# Patient Record
Sex: Female | Born: 1995
Health system: Southern US, Community
[De-identification: ages and names within clinical notes are randomized; demographics above are authoritative.]

## PROBLEM LIST (undated history)

## (undated) DIAGNOSIS — N912 Amenorrhea, unspecified: Secondary | ICD-10-CM

## (undated) DIAGNOSIS — D352 Benign neoplasm of pituitary gland: Secondary | ICD-10-CM

## (undated) DIAGNOSIS — G43009 Migraine without aura, not intractable, without status migrainosus: Secondary | ICD-10-CM

## (undated) DIAGNOSIS — N939 Abnormal uterine and vaginal bleeding, unspecified: Secondary | ICD-10-CM

## (undated) DIAGNOSIS — E282 Polycystic ovarian syndrome: Secondary | ICD-10-CM

## (undated) DIAGNOSIS — E228 Other hyperfunction of pituitary gland: Secondary | ICD-10-CM

## (undated) HISTORY — DX: Amenorrhea, unspecified: N91.2

## (undated) HISTORY — PX: TYMPANOPLASTY: SHX33

## (undated) HISTORY — DX: Other hyperfunction of pituitary gland: E22.8

## (undated) HISTORY — DX: Abnormal uterine and vaginal bleeding, unspecified: N93.9

## (undated) HISTORY — DX: Polycystic ovarian syndrome: E28.2

## (undated) HISTORY — PX: TONSILLECTOMY AND ADENOIDECTOMY: SHX28

## (undated) HISTORY — DX: Migraine without aura, not intractable, without status migrainosus: G43.009

## (undated) HISTORY — DX: Benign neoplasm of pituitary gland: D35.2

---
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
## 2017-01-13 LAB — TSH: TSH: 1.49 mIU/L (ref 0.50–4.30)

## 2017-01-13 LAB — T3, FREE: T3 FREE: 3 pg/mL (ref 3.0–4.7)

## 2017-01-13 LAB — T4, FREE: FREE T4: 1.2 ng/dL (ref 0.8–1.4)

## 2017-01-14 ENCOUNTER — Encounter (INDEPENDENT_AMBULATORY_CARE_PROVIDER_SITE_OTHER): Payer: Self-pay

## 2017-01-21 ENCOUNTER — Other Ambulatory Visit: Payer: Self-pay | Admitting: "Endocrinology

## 2017-01-28 ENCOUNTER — Encounter: Payer: Self-pay | Admitting: Obstetrics and Gynecology

## 2017-01-28 ENCOUNTER — Ambulatory Visit (INDEPENDENT_AMBULATORY_CARE_PROVIDER_SITE_OTHER): Payer: PRIVATE HEALTH INSURANCE | Admitting: "Endocrinology

## 2017-01-28 ENCOUNTER — Ambulatory Visit (INDEPENDENT_AMBULATORY_CARE_PROVIDER_SITE_OTHER): Payer: PRIVATE HEALTH INSURANCE | Admitting: Obstetrics and Gynecology

## 2017-01-28 VITALS — BP 100/68 | HR 100 | Wt 180.0 lb

## 2017-01-28 VITALS — BP 98/62 | HR 76 | Resp 14 | Wt 183.0 lb

## 2017-01-28 DIAGNOSIS — L680 Hirsutism: Secondary | ICD-10-CM

## 2017-01-28 DIAGNOSIS — R1013 Epigastric pain: Secondary | ICD-10-CM

## 2017-01-28 DIAGNOSIS — E049 Nontoxic goiter, unspecified: Secondary | ICD-10-CM | POA: Diagnosis not present

## 2017-01-28 DIAGNOSIS — Z3009 Encounter for other general counseling and advice on contraception: Secondary | ICD-10-CM

## 2017-01-28 DIAGNOSIS — E236 Other disorders of pituitary gland: Secondary | ICD-10-CM

## 2017-01-28 DIAGNOSIS — R7303 Prediabetes: Secondary | ICD-10-CM

## 2017-01-28 DIAGNOSIS — E063 Autoimmune thyroiditis: Secondary | ICD-10-CM

## 2017-01-28 DIAGNOSIS — R7989 Other specified abnormal findings of blood chemistry: Secondary | ICD-10-CM

## 2017-01-28 DIAGNOSIS — E282 Polycystic ovarian syndrome: Secondary | ICD-10-CM

## 2017-01-28 LAB — POCT GLYCOSYLATED HEMOGLOBIN (HGB A1C): Hemoglobin A1C: 4.9

## 2017-01-28 LAB — POCT GLUCOSE (DEVICE FOR HOME USE): Glucose Fasting, POC: 88 mg/dL (ref 70–99)

## 2017-01-28 MED ORDER — METFORMIN HCL 500 MG PO TABS: ORAL_TABLET | ORAL | 3 refills | Status: DC

## 2017-01-28 MED ORDER — RANITIDINE HCL 150 MG PO TABS: ORAL_TABLET | ORAL | 3 refills | Status: DC

## 2017-01-28 MED ORDER — DROSPIRENONE-ETHINYL ESTRADIOL 3-0.03 MG PO TABS: 1.0000 | ORAL_TABLET | Freq: Every day | ORAL | 2 refills | Status: DC

## 2017-01-28 NOTE — Progress Notes (Signed)
Subjective:  Subjective  Patient Name: Anna Parker Date of Birth: Nov 27, 1995  MRN: [id number]  Anna Parker  presents to the office today for follow-up evaluation and management of her history of goiter, oligomenorrhea, elevated testosterone, PCOS, hirsutism, fatigue, dyspepsia, and pituitary cyst/area of differential enhancement.  HISTORY OF PRESENT ILLNESS:   Anna Parker is a 21 y.o. Caucasian young lady.   [name] was unaccompanied.  29. [name] is a 21 y.o. young lady who was followed from an early age for precocious  pubertal development.   A. The child started [facility] agonist therapy as a very young child. I took care of her from 2006 to early 2012. At about that time she stopped her [facility] agonist therapy.    B. As part of her evaluation for precocity, Anna Parker had an MRI scan of her brain, performed with and without contrast in 2006, which revealed an "oval area of differential enhancement measuring 5.8 x 2.9 x 3.2 mm which was c/w a pituitary cyst, Rathke's cleft cyst, or small pituitary microadenoma".  There was also a "tiny area of increased signal on precontrast T1-weighted images along the mid to lower infundibulum".   C. A follow up brain MRI scan was performed with and without contrast in 2008. The radiologist stated that, "The previously identified cystic structure has involuted and the gland is now normal. There was a tiny questioned focus of T1 shortening on precontrast images near the infundibulum. I believe this are too has normalized. It may have represented a partial volume averaging of the clivus."   2. On 01/17/14 [name] returned to our clinic, after a 3-year hiatus, for re-evaluation of her pituitary lesion and was seen by Dr. Baldo Ash. [name] had been discovered to have a benign tumor of the pituitary that had damaged his vision. Dr. Everitt Amber, our pediatric ophthalmologist, felt that she deserved an updated pituitary evaluation given the family history and her personal  history of pituitary findings. Mom also stated that they were told at their last endocrine visit that she would need repeat imaging in 5 years.   A. She had achieved a normal adult height and was at a healthy weight for her height. She reported normal menses every 28-36 days. She had not had significant changes in her vision, severe headaches, or galactorrhea.   B. A follow up MRI, with and without contrast, was performed on 01/27/14. Dr. [name] saw a 5 mm area of differential enhancement posterior to the insertion of the infundibulum. This area was smaller than in 2006 and was less intense than it was on the pre-contrast images from the MRI in 2008. 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 The appearance is not typical for pituitary microadenoma given the location posterior to the adenohypophysis.".   C. Subsequent follow up MRI on 12/28/15 showed that she had a 6.5 mm lesion posterior to the adenohypophysis. 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   3. Anna Parker's last [facility] visit occurred on 10/20/16. In the interim she has been healthy.   A. She is trying to Eat Right, but has not been exercising.    B. She often only gets 6-7 hours of sleep per night. Her energy level is "great".   C. Family had been eating more salads, veggies, and citrus fruits and less sodas and carbs since Xmas. She is now trying to watch her carb intake again.  D. She is taking metformin, 500 mg, twice daily  and ranitidine, 150 mg, twice daily.   4. Pertinent Review of Systems:  Constitutional: The patient feels "really good". She is doing well emotionally.  Eyes: Her eye glasses and contacts are working well. There are no other recognized eye problems.  Neck: The patient has no complaints of anterior neck swelling, soreness, tenderness, pressure, discomfort, or difficulty swallowing.   Heart: Heart rate increases with exercise or other physical activity. The patient has no  complaints of palpitations, irregular heart beats, chest pain, or chest pressure.   Gastrointestinal: Anna Parker is not having much belly hunger. Bowel movents seem normal. The patient has no complaints of acid reflux, upset stomach, stomach aches or pains, diarrhea, or constipation.  Legs: Muscle mass and strength seem normal. There are no complaints of numbness, tingling, burning, or pain. No edema is noted.  Feet: There are no complaints of numbness, tingling, burning, or pain. No edema is noted. Neurologic: There are no recognized problems with muscle movement and strength, sensation, or coordination. GYN: Her LMP began yesterday. Menses occur regularly now and are not unusually heavy.  She is on OCPs now.  Skin: Since starting OCPs she does not have as much facial hair and does not need to shave very often.    PAST MEDICAL, FAMILY, AND SOCIAL HISTORY  Past Medical History:  Diagnosis Date  . Abnormal uterine bleeding   . Amenorrhea   . Central precocious puberty ([place])   . PCOS (polycystic ovarian syndrome)     Family History  Problem Relation Age of Onset  . Asthma Mother   . Depression Father   . Hypertension Maternal Grandmother   . Cancer Maternal Grandmother        Endometrial ca  . Thyroid disease Maternal Grandmother        hypothyroid  . Hypertension Maternal Grandfather   . Cancer Maternal Grandfather        Stage IV pancreatic CA  . Hyperlipidemia Paternal Grandmother   . Seizures Sister 2       1 seizure--unknown cause--none since     Current Outpatient Prescriptions:  .  drospirenone-ethinyl estradiol (YASMIN,ZARAH,SYEDA) 3-0.03 MG tablet, Take 1 tablet by mouth daily., Disp: 1 Package, Rfl: 3 .  fluticasone (FLONASE) 50 MCG/ACT nasal spray, Place 1 spray into both nostrils daily., Disp: , Rfl:  .  metFORMIN (GLUCOPHAGE) 500 MG tablet, TAKE 1 TABLET BY MOUTH TWICE A DAY WITH A MEAL, Disp: 60 tablet, Rfl: 0 .  ranitidine (ZANTAC) 150 MG tablet, TAKE 1 TABLET (150 MG  TOTAL) BY MOUTH 2 (TWO) TIMES DAILY., Disp: 60 tablet, Rfl: 6 .  acetaminophen (TYLENOL) 650 MG CR tablet, Take 650 mg by mouth every 8 (eight) hours as needed for pain., Disp: , Rfl:   Allergies as of 01/28/2017 - Review Complete 01/28/2017  Allergen Reaction Noted  . Amoxicillin-pot clavulanate  02/15/2011  . Lupron depot [leuprolide] Hives 10/28/2016  . Nembutal [pentobarbital sodium]  02/15/2011     reports that she has never smoked. She has never used smokeless tobacco. She reports that she does not drink alcohol or use drugs. Pediatric History  Patient Guardian Status  . Mother:  Weatherford,[name]   Other Topics Concern  . Not on file   Social History Narrative   Is in 9th grade at [employer] at [place] with parents and younger sister, 2 frogs 2 cats   School: She just graduated from high school. She will attend [employer] in [place], [place].   Physical  activities: She has been more physically active.  Primary Care Provider: Dene Gentry, MD  REVIEW OF SYSTEMS: There are no other significant problems involving Brianca's other body systems.    Objective:  Objective  Vital Signs:  BP 100/68   Pulse 100   Wt 180 lb (81.6 kg)   BMI 29.05 kg/m  No height on file for this encounter.   Ht Readings from Last 3 Encounters:  10/28/16 5\' 6"  (1.676 m) (76 %, Z= 0.69)*  10/20/16 5' 6.42" (1.687 m) (80 %, Z= 0.86)*  07/19/16 5' 5.98" (1.676 m) (76 %, Z= 0.69)*   * Growth percentiles are based on CDC 2-20 Years data.   Wt Readings from Last 3 Encounters:  01/28/17 180 lb (81.6 kg) (95 %, Z= 1.66)*  10/28/16 184 lb 9.6 oz (83.7 kg) (96 %, Z= 1.75)*  10/20/16 184 lb (83.5 kg) (96 %, Z= 1.74)*   * Growth percentiles are based on CDC 2-20 Years data.   HC Readings from Last 3 Encounters:  No data found for [facility]   Body surface area is 1.95 meters squared. No height on file for this encounter. 95 %ile (Z= 1.66) based on CDC 2-20 Years weight-for-age data  using vitals from 01/28/2017.    PHYSICAL EXAM:  Constitutional: The patient appears healthy anda bit slimmer. Her height has plateaued at the 75.61%. She has lost 4.5 pounds since her last visit, equivalent to a net calorie deficit of about 150 calories per day. Her weight is at the 95.16%. She is bright and alert, but looks a bit tired today. She is very mature.   Head: The head is normocephalic. Face: The face appears normal, except for minimal residual acne. There are no obvious dysmorphic features. She still has grade 2 sideburns.  Eyes: The eyes appear to be normally formed and spaced. Gaze is conjugate. There is no obvious arcus or proptosis. Moisture appears normal. Ears: The ears are normally placed and appear externally normal. Mouth: The oropharynx and tongue appear normal. Dentition appears to be normal for age. Oral moisture is normal. There is no oral  hyperpigmentation.  Neck: The neck appears to be visibly normal. The thyroid gland is smaller at about 20+ grams in size. The right lobe has shrunk back to normal size and the left lobe is only slightly enlarged. The consistency of the lobes is normal today. The thyroid gland is not tender to palpation. Lungs: The lungs are clear to auscultation. Air movement is good. Heart: Heart rate and rhythm are regular. Heart sounds S1 and S2 are normal. I did not appreciate any pathologic cardiac murmurs. Abdomen: The abdomen is enlarged. Bowel sounds are normal. There is no obvious hepatomegaly, splenomegaly, or other mass effect.  Arms: Muscle size and bulk are normal for age. Hands: There is no obvious tremor. Phalangeal and metacarpophalangeal joints are normal. Palmar muscles are normal for age. Palmar skin is normal. Palmar moisture is also normal. There is no palmar hyperpigmentation. She has no pallor of her fingernails today. Legs: Muscles appear normal for age.  Neurologic: Strength is normal for age in both the upper and lower  extremities. Muscle tone is normal. Sensation to touch is normal in both legs.    LAB DATA:   Results for orders placed or performed in visit on 01/28/17 (from the past 672 hour(s))  POCT Glucose (Device for Home Use)   Collection Time: 01/28/17  8:42 AM  Result Value Ref Range   Glucose Fasting, POC 88 70 -  99 mg/dL   POC Glucose  70 - 99 mg/dl  POCT HgB A1C   Collection Time: 01/28/17  8:49 AM  Result Value Ref Range   Hemoglobin A1C 4.9   Results for orders placed or performed in visit on 12/15/16 (from the past 672 hour(s))  TSH   Collection Time: 01/12/17 10:19 AM  Result Value Ref Range   TSH 1.49 0.50 - 4.30 mIU/L  T4, free   Collection Time: 01/12/17 10:19 AM  Result Value Ref Range   Free T4 1.2 0.8 - 1.4 ng/dL  T3, free   Collection Time: 01/12/17 10:19 AM  Result Value Ref Range   T3, Free 3.0 3.0 - 4.7 pg/mL   Labs 01/28/17: HbA1c 4.9%, CBG 88  Labs 01/12/17: TSH 1.49, free T4 1.1, free T3 3.0  Labs 10/20/16: HbA1c 5.1%, CBG 88  Labs 07/19/16: HbA1c 5.2%  Labs 01/14/16: HbA1c 5.0%  Labs 01/03/16: TSH 1.58, free T4 1.1, free T3 2.9; CBC normal; iron 56  Labs 10/14/15: HbA1c 4.8%  Labs 07/14/15: HbA1c 5.2%  Labs 04/17/15: CMP normal; IGF-1 279, IGFBP-3 5.1  Labs 02/18/15: TSH 1.859, free T4 0.90, free T3 3.5; LH 16, FSH 6.4, testosterone 62, estradiol 45.9; ACTH 18 (normal 9-57), cortisol 12.4 (normal 4.2-22.4); prolactin 7.0; CBC normal; iron 118 (normal 42-145)  IMAGING:   MRI 12/28/15: "Stable 6 mm hyperintense cystic lesion in the posterior sella. 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 There is no expansion of the sella. Pituitary neoplasm is considered unlikely. Calcification or ossification along the posterior meningeal falx. This has not significantly changed. There is no enhancement. This most likely represents benign ossification rather than a meningioma. Recommend 2 year follow-up to assure stability of  this cyst given the patient's age. If the cyst remains stable following puberty, further follow-up may not be necessary.   Assessment and Plan:  Assessment  ASSESSMENT:  1. History of pituitary cyst/abnormal enhancement/brain cyst:   A. This area was smaller on her MRI in 2015 than it was in 2006 and 2008. Dr. [name], a very experienced neuroradiologist, felt that this area did not represent a pituitary tumor per se. She did not have any excessive pituitary hormone secretions. It was highly unlikely that this area was causing her fatigue or oligomenorrhea. However, Dr. [name] also mentioned an area of calcification of the mid-to-posterior falx and recommended follow up.  B. In 2017 her MRI was essentially unchanged. Dr. Jobe Igo was quite comfortable that the 6.5 mm lesion is a Rathke's cleft cyst. He was also comfortable with the ossifications being benign.   C. As can be seen in her labs from June and August 2016, this lesion was non-functional, that is, it was not producing any of the six anterior pituitary hormones. This lesion was also definitely posterior to he adenohypophysis, not part of the pituitary gland as her grand uncle's tumor was said to be.  D. Maternal grand uncle had a pituitary adenoma that affected his optic nerves. 2. Obesity: This problem is a bit better, mostly due to reducing her carb intake since Xmas.     3. Goiter: Her thyroid gland is still enlarged today, but a bit smaller. The process of waxing and waning of thyroid gland size is c/w evolving Hashimoto's thyroiditis. She was euthyroid in May 2017 and again in may 2018. 4. Oligomenorrhea/PCOS/elevated testosterone, hirsutism:   A. The oligomenorrhea resolved with weight loss, but may recur with weight gain.  B. The oligomenorrhea was likely due to her obesity and PCOS/Stein-Leventhal Syndrome (SLS). Her LH/FSH ratio was elevated in June 2016, c/w PCOS/SLS. Her testosterone/estradiol ratio was similarly elevated, also c/w  PCOS/SLS. Her prolactin was normal.    C. Her facial hair has decreased since starting OCPs. Mom has the same problem, as does mom's mother. Analia's hirsutism is not apparent today.  5. Fatigue, other: This problem has resolved. Her TFTs, CBC, iron level, ACTH, and cortisol were all normal in June 2016. We did not find a hormonal cause of her fatigue.  6. Dyspepsia: She has much less dyspepsia since starting metformin and ranitidine and drinking 8 glasses of water per day.  7. Pallor: Resolved. Her CBC and iron concentration were normal in May 2017.   PLAN:  1. Diagnostic: HbA1c and CBG today. Reviewed recent TFTs. Also reviewed past MRI results.  Repeat TFTs in 6 months. Probable repeat of MRI in two years.  2. Therapeutic: Continue metformin, 500 mg, twice daily and ranitidine, 150 mg, twice daily. Continue to Eat Right. Try to exercise for one hour per day.   3. Patient education: I reviewed her medical problems, to include her obesity, PCOS, elevated testosterone, female hirsutism, and the apparent Rathke's cleft cyst. I discussed the fact that obesity is a major risk factor for developing T2DM, hyperlipidemias, and cardiovascular disease. We also discussed her goiter and Hashimoto's disease. We discussed the effects of OCPs on her facial hair issue. Tinlee asked appropriate questions and seemed very pleased with discussion and plan.  4. Follow-up: 6 months    Level of Service: This visit lasted in excess of 65 minutes. More than 50% of the visit was devoted to counseling.  Tillman Sers, MD, CDE Pediatric and Adult Endocrinology

## 2017-01-28 NOTE — Progress Notes (Signed)
GYNECOLOGY  VISIT   HPI: 21 y.o.   Single  Caucasian  female   G0P0000 with Patient's last menstrual period was 01/27/2017.   here for 3 month recheck  For irregular menses.  On Yasmin.  Having regular monthly cycles.  Some lower abdominal pain prior to menses. Bleeds for 3 - 6 days. Pad change every 3 hours.  Less facial hair.   No problems remembering to take them.   Not sexually active.  Going to school near [place].   GYNECOLOGIC HISTORY: Patient's last menstrual period was 01/27/2017. Contraception:  Abstinence/OCP Menopausal hormone therapy:  n/a Last mammogram:  n/a Last pap smear:   n/a        OB History    Gravida Para Term Preterm AB Living   0 0 0 0 0 0   SAB TAB Ectopic Multiple Live Births   0 0 0 0 0         Patient Active Problem List   Diagnosis Date Noted  . Brain cyst 01/14/2016  . Dyspepsia 03/26/2015  . Obesity 02/17/2015  . Oligomenorrhea 02/17/2015  . Other fatigue 02/17/2015  . Goiter 02/17/2015  . Cavus deformity of foot 10/30/2013  . Abnormality of gait 10/30/2013    Past Medical History:  Diagnosis Date  . Abnormal uterine bleeding   . Amenorrhea   . Central precocious puberty ([place])   . PCOS (polycystic ovarian syndrome)     Past Surgical History:  Procedure Laterality Date  . left ankle fracture Left 2003  . left ankle repair Left 2016  . TONSILLECTOMY AND ADENOIDECTOMY    . TYMPANOPLASTY      Current Outpatient Prescriptions  Medication Sig Dispense Refill  . acetaminophen (TYLENOL) 650 MG CR tablet Take 650 mg by mouth every 8 (eight) hours as needed for pain.    . drospirenone-ethinyl estradiol (YASMIN,ZARAH,SYEDA) 3-0.03 MG tablet Take 1 tablet by mouth daily. 1 Package 3  . fluticasone (FLONASE) 50 MCG/ACT nasal spray Place 1 spray into both nostrils daily.    . metFORMIN (GLUCOPHAGE) 500 MG tablet TAKE 1 TABLET BY MOUTH TWICE A DAY WITH A MEAL 180 tablet 3  . ranitidine (ZANTAC) 150 MG tablet TAKE 1 TABLET (150 MG  TOTAL) BY MOUTH 2 (TWO) TIMES DAILY. 180 tablet 3   No current facility-administered medications for this visit.      ALLERGIES: Amoxicillin-pot clavulanate; Lupron depot [leuprolide]; and Nembutal [pentobarbital sodium]  Family History  Problem Relation Age of Onset  . Asthma Mother   . Depression Father   . Hypertension Maternal Grandmother   . Cancer Maternal Grandmother        Endometrial ca  . Thyroid disease Maternal Grandmother        hypothyroid  . Hypertension Maternal Grandfather   . Cancer Maternal Grandfather        Stage IV pancreatic CA  . Hyperlipidemia Paternal Grandmother   . Seizures Sister 2       1 seizure--unknown cause--none since    Social History   Social History  . Marital status: Single    Spouse name: N/A  . Number of children: N/A  . Years of education: N/A   Occupational History  . Not on file.   Social History Main Topics  . Smoking status: Never Smoker  . Smokeless tobacco: Never Used  . Alcohol use No  . Drug use: No  . Sexual activity: No   Other Topics Concern  . Not on file   Social History  Narrative   Is in 9th grade at [employer] at [place] with parents and younger sister, 2 frogs 2 cats    ROS:  Pertinent items are noted in HPI.  PHYSICAL EXAMINATION:    BP 98/62 (BP Location: Right Arm, Patient Position: Sitting, Cuff Size: Normal)   Pulse 76   Resp 14   Wt 183 lb (83 kg)   LMP 01/27/2017   BMI 29.54 kg/m     General appearance: alert, cooperative and appears stated age   ASSESSMENT Hx central precious puberty.  PCOS.  Doing well on Yasmin.  PLAN  Continue Yasmin.  We discussed how to achieve amenorrhea by skipping the placebo pills.  Discussed condom use for STD prevention if she becomes sexually active.  Best wishes for a successful year at school.  Follow up yearly and prn.    An After Visit Summary was printed and given to the patient.  __15____ minutes face to face time of which over 50%  was spent in counseling.

## 2017-01-28 NOTE — Patient Instructions (Signed)
Follow up visit in 6 months. Please repeat blood tests one week prior.

## 2017-01-30 ENCOUNTER — Encounter (INDEPENDENT_AMBULATORY_CARE_PROVIDER_SITE_OTHER): Payer: Self-pay | Admitting: "Endocrinology

## 2017-01-30 DIAGNOSIS — R7989 Other specified abnormal findings of blood chemistry: Secondary | ICD-10-CM | POA: Insufficient documentation

## 2017-01-30 DIAGNOSIS — E282 Polycystic ovarian syndrome: Secondary | ICD-10-CM | POA: Insufficient documentation

## 2017-01-30 DIAGNOSIS — L680 Hirsutism: Secondary | ICD-10-CM | POA: Insufficient documentation

## 2017-02-21 ENCOUNTER — Other Ambulatory Visit: Payer: Self-pay | Admitting: Obstetrics and Gynecology

## 2017-04-05 ENCOUNTER — Encounter (INDEPENDENT_AMBULATORY_CARE_PROVIDER_SITE_OTHER): Payer: Self-pay | Admitting: "Endocrinology

## 2017-11-13 ENCOUNTER — Other Ambulatory Visit: Payer: Self-pay | Admitting: Obstetrics and Gynecology

## 2017-11-14 NOTE — Telephone Encounter (Signed)
Medication refill request: OCP Last OV:  01/28/17 BS  Next AEX: 02/08/18  Last MMG (if hormonal medication request): n/a Refill authorized: 01/28/17 #3, 2RF. Today, please advise.

## 2018-02-08 ENCOUNTER — Encounter: Payer: Self-pay | Admitting: Obstetrics and Gynecology

## 2018-02-08 ENCOUNTER — Other Ambulatory Visit: Payer: Self-pay

## 2018-02-08 ENCOUNTER — Ambulatory Visit (INDEPENDENT_AMBULATORY_CARE_PROVIDER_SITE_OTHER): Payer: PRIVATE HEALTH INSURANCE | Admitting: Obstetrics and Gynecology

## 2018-02-08 VITALS — BP 108/70 | HR 68 | Resp 16 | Ht 66.5 in | Wt 180.0 lb

## 2018-02-08 DIAGNOSIS — Z0000 Encounter for general adult medical examination without abnormal findings: Secondary | ICD-10-CM

## 2018-02-08 MED ORDER — DROSPIRENONE-ETHINYL ESTRADIOL 3-0.03 MG PO TABS: 1.0000 | ORAL_TABLET | Freq: Every day | ORAL | 3 refills | Status: DC

## 2018-02-08 NOTE — Patient Instructions (Signed)

## 2018-02-08 NOTE — Progress Notes (Signed)
22 y.o. G0P0000 Single Caucasian female here for annual exam.    Having menses on OCPs.  Flow normal per patient.  No cramps.  Missed one night per month and takes it the next day.  No spotting with this.   Not sexually active.   Ingrown hairs on her pubic area with shaving so she stopped this.  Back home from school.  Labs with Dr. Tobe Sos.   PCP: Carollee Herter, MD    Dr. Tobe Sos - endocrinology. May establish care at [facility].   Patient's last menstrual period was 01/31/2018.           Sexually active: No.  The current method of family planning is Yasmin.    Exercising: No.  The patient does not participate in regular exercise at present. Smoker:  no  Health Maintenance: Pap:  n/a History of abnormal Pap:  n/a TDaP:  Up to date -- patient in [employer] [place]:   Yes, completed series Screening Labs:  Endocrinologist    reports that she has never smoked. She has never used smokeless tobacco. She reports that she drinks alcohol. She reports that she does not use drugs.  Past Medical History:  Diagnosis Date  . Abnormal uterine bleeding   . Amenorrhea   . Central precocious puberty ([place])   . PCOS (polycystic ovarian syndrome)     Past Surgical History:  Procedure Laterality Date  . left ankle fracture Left 2003  . left ankle repair Left 2016  . TONSILLECTOMY AND ADENOIDECTOMY    . TYMPANOPLASTY      Current Outpatient Medications  Medication Sig Dispense Refill  . acetaminophen (TYLENOL) 650 MG CR tablet Take 650 mg by mouth every 8 (eight) hours as needed for pain.    . drospirenone-ethinyl estradiol (YASMIN,ZARAH,SYEDA) 3-0.03 MG tablet TAKE 1 TABLET BY MOUTH EVERY DAY 84 tablet 0  . fluticasone (FLONASE) 50 MCG/ACT nasal spray Place 1 spray into both nostrils daily.    . metFORMIN (GLUCOPHAGE) 500 MG tablet TAKE 1 TABLET BY MOUTH TWICE A DAY WITH A MEAL 180 tablet 3  . ranitidine (ZANTAC) 150 MG tablet TAKE 1 TABLET (150 MG TOTAL) BY  MOUTH 2 (TWO) TIMES DAILY. 180 tablet 3   No current facility-administered medications for this visit.     Family History  Problem Relation Age of Onset  . Asthma Mother   . Depression Father   . Hypertension Maternal Grandmother   . Cancer Maternal Grandmother        Endometrial ca  . Thyroid disease Maternal Grandmother        hypothyroid  . Hypertension Maternal Grandfather   . Cancer Maternal Grandfather        Stage IV pancreatic CA  . Hyperlipidemia Paternal Grandmother   . Seizures Sister 2       1 seizure--unknown cause--none since    Review of Systems  Constitutional: Negative.   HENT: Negative.   Eyes: Negative.   Respiratory: Negative.   Cardiovascular: Negative.   Gastrointestinal: Negative.   Endocrine: Negative.   Genitourinary: Negative.   Musculoskeletal: Negative.   Skin: Negative.   Allergic/Immunologic: Negative.   Neurological: Negative.   Hematological: Negative.   Psychiatric/Behavioral: Negative.     Exam:   BP 108/70 (BP Location: Right Arm, Patient Position: Sitting, Cuff Size: Normal)   Pulse 68   Resp 16   Ht 5' 6.5" (1.689 m)   Wt 180 lb (81.6 kg)   LMP 01/31/2018   BMI 28.62 kg/m  General appearance: alert, cooperative and appears stated age Head: Normocephalic, without obvious abnormality, atraumatic Neck: no adenopathy, supple, symmetrical, trachea midline and thyroid normal to inspection and palpation Lungs: clear to auscultation bilaterally Breasts: normal appearance, no masses or tenderness, No nipple retraction or dimpling, No nipple discharge or bleeding, No axillary or supraclavicular adenopathy Heart: regular rate and rhythm Abdomen: soft, non-tender; no masses, no organomegaly Extremities: extremities normal, atraumatic, no cyanosis or edema Skin: Skin color, texture, turgor normal. No rashes or lesions Lymph nodes: Cervical, supraclavicular, and axillary nodes normal. No abnormal inguinal nodes palpated Neurologic:  Grossly normal  Pelvic: External genitalia:  Some erythema of hair follicles.                 Chaperone was present for exam.  Assessment:   Well woman visit with normal exam. Hx central precocious puberty.  PCOS.  Plan: Mammogram screening age 74.  Recommended self breast awareness. Pap and HR HPV as above. Guidelines for Calcium, Vitamin D, regular exercise program including cardiovascular and weight bearing exercise. Refill of Yasmin for one year.  We discussed STD prevention.  Labs with endocrinologist.  Follow up annually and prn.   After visit summary provided.

## 2018-03-31 ENCOUNTER — Other Ambulatory Visit (INDEPENDENT_AMBULATORY_CARE_PROVIDER_SITE_OTHER): Payer: Self-pay | Admitting: "Endocrinology

## 2018-03-31 DIAGNOSIS — R7303 Prediabetes: Secondary | ICD-10-CM

## 2018-04-05 ENCOUNTER — Encounter (INDEPENDENT_AMBULATORY_CARE_PROVIDER_SITE_OTHER): Payer: Self-pay | Admitting: "Endocrinology

## 2018-04-05 ENCOUNTER — Ambulatory Visit (INDEPENDENT_AMBULATORY_CARE_PROVIDER_SITE_OTHER): Payer: PRIVATE HEALTH INSURANCE | Admitting: "Endocrinology

## 2018-04-05 VITALS — BP 122/78 | HR 68 | Ht 67.17 in | Wt 181.6 lb

## 2018-04-05 DIAGNOSIS — E049 Nontoxic goiter, unspecified: Secondary | ICD-10-CM

## 2018-04-05 DIAGNOSIS — L680 Hirsutism: Secondary | ICD-10-CM

## 2018-04-05 DIAGNOSIS — E063 Autoimmune thyroiditis: Secondary | ICD-10-CM | POA: Diagnosis not present

## 2018-04-05 DIAGNOSIS — R7303 Prediabetes: Secondary | ICD-10-CM

## 2018-04-05 DIAGNOSIS — E6609 Other obesity due to excess calories: Secondary | ICD-10-CM | POA: Diagnosis not present

## 2018-04-05 DIAGNOSIS — R5383 Other fatigue: Secondary | ICD-10-CM

## 2018-04-05 DIAGNOSIS — G930 Cerebral cysts: Secondary | ICD-10-CM

## 2018-04-05 DIAGNOSIS — R1013 Epigastric pain: Secondary | ICD-10-CM

## 2018-04-05 LAB — POCT GLYCOSYLATED HEMOGLOBIN (HGB A1C): Hemoglobin A1C: 5.1 % (ref 4.0–5.6)

## 2018-04-05 LAB — POCT GLUCOSE (DEVICE FOR HOME USE): POC Glucose: 107 mg/dl — AB (ref 70–99)

## 2018-04-05 MED ORDER — RANITIDINE HCL 150 MG PO TABS: ORAL_TABLET | ORAL | 3 refills | Status: DC

## 2018-04-05 MED ORDER — METFORMIN HCL 500 MG PO TABS: ORAL_TABLET | ORAL | 3 refills | Status: DC

## 2018-04-05 NOTE — Progress Notes (Signed)
Subjective:  Subjective  Patient Name: Anna Parker Date of Birth: [birth year]  MRN: [id number]  Anna Parker  presents to the office today for follow-up evaluation and management of her history of goiter, oligomenorrhea, elevated testosterone, PCOS, hirsutism, fatigue, dyspepsia, and pituitary cyst/area of differential enhancement.  HISTORY OF PRESENT ILLNESS:   Anna Parker is a 22 y.o. Caucasian young lady.   Anna Parker was unaccompanied.  16. Anna Parker is a 22 y.o. young lady who was followed from an early age for precocious  pubertal development.   A. The child started [facility] agonist therapy as a very young child. I took care of her from 2006 to early 2012. At about that time she stopped her [facility] agonist therapy.    B. As part of her evaluation for precocity, Anna Parker had an MRI scan of her brain, performed with and without contrast in 2006, which revealed an "oval area of differential enhancement measuring 5.8 x 2.9 x 3.2 mm which was c/w a pituitary cyst, Rathke's cleft cyst, or small pituitary microadenoma".  There was also a "tiny area of increased signal on precontrast T1-weighted images along the mid to lower infundibulum".   C. A follow up brain MRI scan was performed with and without contrast in 2008. The radiologist stated that, "The previously identified cystic structure has involuted and the gland is now normal. There was a tiny questioned focus of T1 shortening on precontrast images near the infundibulum. I believe this are too has normalized. It may have represented a partial volume averaging of the clivus."   2. On 01/17/14 Anna Parker returned to our clinic, after a 3-year hiatus, for re-evaluation of her pituitary lesion and was seen by Dr. Baldo Ash. [name] had been discovered to have a benign tumor of the pituitary that had damaged his vision. Dr. Everitt Amber, our pediatric ophthalmologist, felt that she deserved an updated pituitary evaluation given the family history and her personal  history of pituitary findings. Mom also stated that they were told at their last endocrine visit that she would need repeat imaging in 5 years.   A. She had achieved a normal adult height and was at a healthy weight for her height. She reported normal menses every 28-36 days. She had not had significant changes in her vision, severe headaches, or galactorrhea.   B. A follow up MRI, with and without contrast, was performed on 01/27/14. Dr. [name] saw a 5 mm area of differential enhancement posterior to the insertion of the infundibulum. This area was smaller than in 2006 and was less intense than it was on the pre-contrast images from the MRI in 2008. 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 The appearance is not typical for pituitary microadenoma given the location posterior to the adenohypophysis.".   C. Subsequent follow up MRI on 12/28/15 showed that she had a 6.5 mm lesion posterior to the adenohypophysis. 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   3. Anna Parker's last [facility] visit occurred on 01/28/17. In the interim she has been healthy.   A. She has been trying to Eat Right and she has been exercising more. She has recently been on vacation, but tried to prevent weight gain.   B. Her energy level is "really great".   C. She has been eating more salads, veggies, and citrus fruits and less sodas and carbs. She has been more physically active.   D. She is taking metformin, 500 mg, twice daily and  ranitidine, 150 mg, twice daily.   E. She sometimes has headaches, especially if her allergies are acting up.  4. Pertinent Review of Systems:  Constitutional: The patient feels "really good". She is doing well emotionally.  Eyes: Her eye glasses and contacts are working well. There are no other recognized eye problems.  Neck: The patient has no complaints of anterior neck swelling, soreness, tenderness, pressure, discomfort, or difficulty swallowing.    Heart: Heart rate increases with exercise or other physical activity. The patient has no complaints of palpitations, irregular heart beats, chest pain, or chest pressure.   Gastrointestinal: Anna Parker is not having much belly hunger. It is easy now to tell when she is full.  Bowel movents seem normal. The patient has no complaints of acid reflux, upset stomach, stomach aches or pains, diarrhea, or constipation.  Legs: Muscle mass and strength seem normal. There are no complaints of numbness, tingling, burning, or pain. No edema is noted.  Feet: There are no complaints of numbness, tingling, burning, or pain. No edema is noted. Neurologic: There are no recognized problems with muscle movement and strength, sensation, or coordination. GYN: Her LMP was in July. Menses occur regularly now and are not unusually heavy.  She is still on OCPs.  Skin: Since starting OCPs she does not have as much facial hair, but she does shave the corners of her mouth about once a week. She sometimes shaves her stomach.  PAST MEDICAL, FAMILY, AND SOCIAL HISTORY  Past Medical History:  Diagnosis Date  . Abnormal uterine bleeding   . Amenorrhea   . Central precocious puberty ([place])   . PCOS (polycystic ovarian syndrome)     Family History  Problem Relation Age of Onset  . Asthma Mother   . Depression Father   . Hypertension Maternal Grandmother   . Cancer Maternal Grandmother        Endometrial ca  . Thyroid disease Maternal Grandmother        hypothyroid  . Hypertension Maternal Grandfather   . Cancer Maternal Grandfather        Stage IV pancreatic CA  . Hyperlipidemia Paternal Grandmother   . Seizures Sister 2       1 seizure--unknown cause--none since   S  Current Outpatient Medications:  .  drospirenone-ethinyl estradiol (YASMIN,ZARAH,SYEDA) 3-0.03 MG tablet, Take 1 tablet by mouth daily., Disp: 84 tablet, Rfl: 3 .  fluticasone (FLONASE) 50 MCG/ACT nasal spray, Place 1 spray into both nostrils daily.,  Disp: , Rfl:  .  metFORMIN (GLUCOPHAGE) 500 MG tablet, TAKE 1 TABLET BY MOUTH TWICE A DAY WITH A MEAL, Disp: 60 tablet, Rfl: 11 .  acetaminophen (TYLENOL) 650 MG CR tablet, Take 650 mg by mouth every 8 (eight) hours as needed for pain., Disp: , Rfl:  .  ranitidine (ZANTAC) 150 MG tablet, TAKE 1 TABLET (150 MG TOTAL) BY MOUTH 2 (TWO) TIMES DAILY., Disp: 180 tablet, Rfl: 3  Allergies as of 04/05/2018 - Review Complete 04/05/2018  Allergen Reaction Noted  . Amoxicillin-pot clavulanate  02/15/2011  . Lupron depot [leuprolide] Hives 10/28/2016  . Nembutal [pentobarbital sodium]  02/15/2011     reports that she has never smoked. She has never used smokeless tobacco. She reports that she drinks alcohol. She reports that she does not use drugs. Pediatric History  Patient Guardian Status  . Mother:  Belay,[name]   Other Topics Concern  . Not on file  Social History Narrative   Is in 9th grade at [employer] at [employer]  Lives with parents and younger sister, 2 frogs 2 cats   School: She just finished her freshman year at [employer] in [place], [place].  She is pre-med. She wants to be a DO and a physiatrist.  Physical activities: She has been more physically active.  Primary Care Provider: Patient, No Pcp Per  REVIEW OF SYSTEMS: There are no other significant problems involving Daniele's other body systems.    Objective:  Objective  Vital Signs:  BP 122/78   Pulse 68   Ht 5' 7.17" (1.706 m)   Wt 181 lb 9.6 oz (82.4 kg)   BMI 28.30 kg/m  Blood pressure percentiles are not available for patients who are 18 years or older.   Ht Readings from Last 3 Encounters:  04/05/18 5' 7.17" (1.706 m) (87 %, Z= 1.13)*  02/08/18 5' 6.5" (1.689 m) (81 %, Z= 0.87)*  10/28/16 5\' 6"  (1.676 m) (76 %, Z= 0.69)*   * Growth percentiles are based on CDC (Girls, 2-20 Years) data.   Wt Readings from Last 3 Encounters:  04/05/18 181 lb 9.6 oz (82.4 kg) (95 %, Z= 1.63)*  02/08/18 180 lb (81.6  kg) (95 %, Z= 1.61)*  01/28/17 183 lb (83 kg) (96 %, Z= 1.71)*   * Growth percentiles are based on CDC (Girls, 2-20 Years) data.   HC Readings from Last 3 Encounters:  No data found for [facility]   Body surface area is 1.98 meters squared. 87 %ile (Z= 1.13) based on CDC (Girls, 2-20 Years) Stature-for-age data based on Stature recorded on 04/05/2018. 95 %ile (Z= 1.63) based on CDC (Girls, 2-20 Years) weight-for-age data using vitals from 04/05/2018.    PHYSICAL EXAM:  Constitutional: The patient appears healthy and a bit slimmer. Her height has plateaued at the 87%. She has lost 1.5 pounds since her last visit. Her weight is at the 94.88%. Her BMI has decreased to the 90.58%. She is bright and alert and looks great today. She is very mature.   Head: The head is normocephalic. Face: The face appears normal. There are no obvious dysmorphic features. She still has grade 2 sideburns.  Eyes: The eyes appear to be normally formed and spaced. Gaze is conjugate. There is no obvious arcus or proptosis. Moisture appears normal. Ears: The ears are normally placed and appear externally normal. Mouth: The oropharynx and tongue appear normal. Dentition appears to be normal for age. Oral moisture is normal. There is no oral  hyperpigmentation.  Neck: The neck appears to be visibly normal. The thyroid gland is again mildly enlarged at about 20+ grams in size. Both lobes are slightly enlarged today.  The consistency of the lobes is somewhat full today. The thyroid gland is not tender to palpation. Lungs: The lungs are clear to auscultation. Air movement is good. Heart: Heart rate and rhythm are regular. Heart sounds S1 and S2 are normal. I did not appreciate any pathologic cardiac murmurs. Abdomen: The abdomen is enlarged. Bowel sounds are normal. There is no obvious hepatomegaly, splenomegaly, or other mass effect.  Arms: Muscle size and bulk are normal for age. Hands: There is no obvious tremor. Phalangeal and  metacarpophalangeal joints are normal. Palmar muscles are normal for age. Palmar skin is normal. Palmar moisture is also normal. There is no palmar hyperpigmentation. She has no pallor of her fingernails today. Legs: Muscles appear normal for age.  Neurologic: Strength is normal for age in both the upper and lower extremities. Muscle tone is normal. Sensation to  touch is normal in both legs.    LAB DATA:   Results for orders placed or performed in visit on 04/05/18 (from the past 672 hour(s))  POCT Glucose (Device for Home Use)   Collection Time: 04/05/18  3:22 PM  Result Value Ref Range   Glucose Fasting, POC  70 - 99 mg/dL   POC Glucose 107 (A) 70 - 99 mg/dl   Labs 03/718: HbA1c 5.1%, CBG 107  Labs 01/28/17: HbA1c 4.9%, CBG 88  Labs 01/12/17: TSH 1.49, free T4 1.1, free T3 3.0  Labs 10/20/16: HbA1c 5.1%, CBG 88  Labs 07/19/16: HbA1c 5.2%  Labs 01/14/16: HbA1c 5.0%  Labs 01/03/16: TSH 1.58, free T4 1.1, free T3 2.9; CBC normal; iron 56  Labs 10/14/15: HbA1c 4.8%  Labs 07/14/15: HbA1c 5.2%  Labs 04/17/15: CMP normal; IGF-1 279, IGFBP-3 5.1  Labs 02/18/15: TSH 1.859, free T4 0.90, free T3 3.5; LH 16, FSH 6.4, testosterone 62, estradiol 45.9; ACTH 18 (normal 9-57), cortisol 12.4 (normal 4.2-22.4); prolactin 7.0; CBC normal; iron 118 (normal 42-145)  IMAGING:   MRI 12/28/15: "Stable 6 mm hyperintense cystic lesion in the posterior sella. 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 There is no expansion of the sella. Pituitary neoplasm is considered unlikely. Calcification or ossification along the posterior meningeal falx. This has not significantly changed. There is no enhancement. This most likely represents benign ossification rather than a meningioma. Recommend 2 year follow-up to assure stability of this cyst given the patient's age. If the cyst remains stable following puberty, further follow-up may not be necessary.   Assessment and Plan:   Assessment  ASSESSMENT:  1. History of pituitary cyst/abnormal enhancement/brain cyst:   A. This area was smaller on her MRI in 2015 than it was in 2006 and 2008. Dr. [name], a very experienced neuroradiologist, felt that this area did not represent a pituitary tumor per se. She did not have any excessive pituitary hormone secretions. It was highly unlikely that this area was causing her fatigue or oligomenorrhea. However, Dr. [name] also mentioned an area of calcification of the mid-to-posterior falx and recommended follow up.  B. In 2017 her MRI was essentially unchanged. Dr. Jobe Igo was quite comfortable that the 6.5 mm lesion is a Rathke's cleft cyst. He was also comfortable with the ossifications being benign.   C. As can be seen in her labs from June and August 2016, this lesion was non-functional, that is, it was not producing any excess amounts of the six anterior pituitary hormones. This lesion was also definitely posterior to he adenohypophysis, not part of the pituitary gland as her grand uncle's tumor was said to be.  D. Maternal grand uncle had a pituitary adenoma that affected his optic nerves.  E. Thus far there has not been any evidence of clinical problems.  2. Overweight: This problem is a little better.  3. Goiter: Her thyroid gland is still enlarged today, but the lobes have shifted in size again. The process of waxing and waning of thyroid gland size is c/w evolving Hashimoto's thyroiditis. She was euthyroid in May 2017 and again in May 2018. 4. Oligomenorrhea/PCOS/elevated testosterone, hirsutism:   A. The oligomenorrhea resolved with weight loss, but may recur with weight gain.    B. The oligomenorrhea was likely due to her obesity and PCOS/Stein-Leventhal Syndrome (SLS). Her LH/FSH ratio was elevated in June 2016, c/w PCOS/SLS. Her testosterone/estradiol ratio was similarly elevated, also c/w PCOS/SLS. Her prolactin was normal.  C. Her facial hair has markedly decreased since  starting OCPs. Mom has the same problem, as does mom's mother. Odell's hirsutism is not apparent today.  5. Fatigue, other: This problem has resolved. Her TFTs, CBC, iron level, ACTH, and cortisol were all normal in June 2016. We did not find a hormonal cause of her fatigue.  6. Dyspepsia: She has much less dyspepsia since starting metformin and ranitidine and drinking 8 glasses of water per day.  7. Pallor: Resolved. Her CBC and iron concentration were normal in May 2017.  PLAN:  1. Diagnostic: HbA1c and CBG today. TFTs, CMP, and CBC today. Also reviewed past MRI results.  Repeat TFTs in 12 months. I offered to repeat her MRI now, but she prefers to wait to do the MRI until December.  2. Therapeutic: Continue metformin, 500 mg, twice daily and ranitidine, 150 mg, twice daily. Continue to Eat Right. Try to exercise for one hour per day.   3. Patient education: I reviewed her medical problems, to include her obesity, PCOS, elevated testosterone, female hirsutism, and the apparent Rathke's cleft cyst. I discussed the fact that obesity is a major risk factor for developing T2DM, hyperlipidemias, cardiovascular disease, and cancer. We also discussed her goiter and Hashimoto's disease. We discussed the effects of OCPs on her facial hair issue. Lequita asked appropriate questions and seemed very pleased with discussion and plan.  4. Follow-up: 4 months    Level of Service: This visit lasted in excess of 60 minutes. More than 50% of the visit was devoted to counseling.  Tillman Sers, MD, CDE Pediatric and Adult Endocrinology

## 2018-04-05 NOTE — Patient Instructions (Signed)
Follow up in 4 months 

## 2018-04-06 LAB — CBC WITH DIFFERENTIAL/PLATELET
BASOS ABS: 38 {cells}/uL (ref 0–200)
BASOS PCT: 0.5 %
Eosinophils Absolute: 98 cells/uL (ref 15–500)
Eosinophils Relative: 1.3 %
HCT: 39.7 % (ref 35.0–45.0)
HEMOGLOBIN: 12.9 g/dL (ref 11.7–15.5)
LYMPHS ABS: 1860 {cells}/uL (ref 850–3900)
MCH: 26.9 pg — ABNORMAL LOW (ref 27.0–33.0)
MCHC: 32.5 g/dL (ref 32.0–36.0)
MCV: 82.7 fL (ref 80.0–100.0)
MPV: 12 fL (ref 7.5–12.5)
Monocytes Relative: 7.5 %
NEUTROS PCT: 65.9 %
Neutro Abs: 4943 cells/uL (ref 1500–7800)
PLATELETS: 208 10*3/uL (ref 140–400)
RBC: 4.8 10*6/uL (ref 3.80–5.10)
RDW: 13.5 % (ref 11.0–15.0)
Total Lymphocyte: 24.8 %
WBC: 7.5 10*3/uL (ref 3.8–10.8)
WBCMIX: 563 {cells}/uL (ref 200–950)

## 2018-04-06 LAB — COMPREHENSIVE METABOLIC PANEL
AG Ratio: 1.5 (calc) (ref 1.0–2.5)
ALBUMIN MSPROF: 4.1 g/dL (ref 3.6–5.1)
ALT: 10 U/L (ref 5–32)
AST: 13 U/L (ref 12–32)
Alkaline phosphatase (APISO): 53 U/L (ref 47–176)
BILIRUBIN TOTAL: 0.3 mg/dL (ref 0.2–1.1)
BUN: 14 mg/dL (ref 7–20)
CO2: 25 mmol/L (ref 20–32)
CREATININE: 0.75 mg/dL (ref 0.50–1.00)
Calcium: 9.5 mg/dL (ref 8.9–10.4)
Chloride: 104 mmol/L (ref 98–110)
Globulin: 2.7 g/dL (calc) (ref 2.0–3.8)
Glucose, Bld: 94 mg/dL (ref 65–99)
POTASSIUM: 4.3 mmol/L (ref 3.8–5.1)
Sodium: 140 mmol/L (ref 135–146)
Total Protein: 6.8 g/dL (ref 6.3–8.2)

## 2018-04-06 LAB — T3, FREE: T3, Free: 3.3 pg/mL (ref 3.0–4.7)

## 2018-04-06 LAB — T4, FREE: FREE T4: 1.2 ng/dL (ref 0.8–1.4)

## 2018-04-06 LAB — TSH: TSH: 0.96 m[IU]/L

## 2018-04-10 ENCOUNTER — Encounter (INDEPENDENT_AMBULATORY_CARE_PROVIDER_SITE_OTHER): Payer: Self-pay | Admitting: *Deleted

## 2018-08-17 ENCOUNTER — Ambulatory Visit (INDEPENDENT_AMBULATORY_CARE_PROVIDER_SITE_OTHER): Payer: PRIVATE HEALTH INSURANCE | Admitting: "Endocrinology

## 2018-08-17 ENCOUNTER — Encounter (INDEPENDENT_AMBULATORY_CARE_PROVIDER_SITE_OTHER): Payer: Self-pay | Admitting: "Endocrinology

## 2018-08-17 VITALS — BP 118/74 | HR 90 | Ht 66.69 in | Wt 182.2 lb

## 2018-08-17 DIAGNOSIS — E049 Nontoxic goiter, unspecified: Secondary | ICD-10-CM | POA: Diagnosis not present

## 2018-08-17 DIAGNOSIS — N914 Secondary oligomenorrhea: Secondary | ICD-10-CM

## 2018-08-17 DIAGNOSIS — E236 Other disorders of pituitary gland: Secondary | ICD-10-CM | POA: Diagnosis not present

## 2018-08-17 DIAGNOSIS — Z68.41 Body mass index (BMI) pediatric, 85th percentile to less than 95th percentile for age: Secondary | ICD-10-CM | POA: Diagnosis not present

## 2018-08-17 DIAGNOSIS — R718 Other abnormality of red blood cells: Secondary | ICD-10-CM

## 2018-08-17 DIAGNOSIS — E663 Overweight: Secondary | ICD-10-CM

## 2018-08-17 DIAGNOSIS — R1013 Epigastric pain: Secondary | ICD-10-CM

## 2018-08-17 LAB — POCT GLUCOSE (DEVICE FOR HOME USE): POC Glucose: 101 mg/dl — AB (ref 70–99)

## 2018-08-17 LAB — POCT GLYCOSYLATED HEMOGLOBIN (HGB A1C): Hemoglobin A1C: 5 % (ref 4.0–5.6)

## 2018-08-17 NOTE — Patient Instructions (Signed)
Follow up visit in 6 months. 

## 2018-08-17 NOTE — Progress Notes (Signed)
Subjective:  Subjective  Patient Name: Anna Parker Date of Birth: 10/05/1995  MRN: [id number]  Anna Parker  presents to the office today for follow-up evaluation and management of her history of goiter, oligomenorrhea, elevated testosterone, PCOS, hirsutism, fatigue, dyspepsia, and pituitary cyst/area of differential enhancement.  HISTORY OF PRESENT ILLNESS:   [name] is a 22 y.o. Caucasian young lady.   Lashun was accompanied by her mother.  30. Brianny is a 24 y.o. young lady who was followed from an early age for precocious  pubertal development.   A. The child started [facility] agonist therapy as a very young child. I took care of her from 2006 to early 2012. At about that time she stopped her [facility] agonist therapy.    B. As part of her evaluation for precocity, Anna Parker had an MRI scan of her brain, performed with and without contrast in 2006, which revealed an "oval area of differential enhancement measuring 5.8 x 2.9 x 3.2 mm which was c/w a pituitary cyst, Rathke's cleft cyst, or small pituitary microadenoma".  There was also a "tiny area of increased signal on precontrast T1-weighted images along the mid to lower infundibulum".   C. A follow up brain MRI scan was performed with and without contrast in 2008. The radiologist stated that, "The previously identified cystic structure has involuted and the gland is now normal. There was a tiny questioned focus of T1 shortening on precontrast images near the infundibulum. I believe this are too has normalized. It may have represented a partial volume averaging of the clivus."   2. On 01/17/14 [name] returned to our clinic, after a 3-year hiatus, for re-evaluation of her pituitary lesion and was seen by Dr. Baldo Ash. [name] had been discovered to have a benign tumor of the pituitary that had damaged his vision. Dr. Everitt Amber, our pediatric ophthalmologist, felt that she deserved an updated pituitary evaluation given the family history and  her personal history of pituitary findings. Mom also stated that they were told at their last endocrine visit that she would need repeat imaging in 5 years.   A. She had achieved a normal adult height and was at a healthy weight for her height. She reported normal menses every 28-36 days. She had not had significant changes in her vision, severe headaches, or galactorrhea.   B. A follow up MRI, with and without contrast, was performed on 01/27/14. Dr. [name] saw a 5 mm area of differential enhancement posterior to the insertion of the infundibulum. This area was smaller than in 2006 and was less intense than it was on the pre-contrast images from the MRI in 2008. 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 The appearance is not typical for pituitary microadenoma given the location posterior to the adenohypophysis.".   C. Subsequent follow up MRI on 12/28/15 showed that she had a 6.5 mm lesion posterior to the adenohypophysis. 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   3. Anna Parker's last [facility] visit occurred on 04/05/18. In the interim she has been healthy. At that visit I continued her metformin and ranitidine.   A. She has been trying to Eat Right, but has been exercising less.   B. Her energy level is usually "really good".   C. She has been eating more salads, veggies, and citrus fruits and less sodas and carbs.    D. She is taking metformin, 500 mg, twice daily and ranitidine, 150 mg, twice daily.  E. She has had more headaches around finals time.   4. Pertinent Review of Systems:  Constitutional: The patient feels "really good". She is doing well emotionally.  Eyes: Her eye glasses and contacts are working well. There are no other recognized eye problems.  Neck: The patient has no complaints of anterior neck swelling, soreness, tenderness, pressure, discomfort, or difficulty swallowing.   Heart: Heart rate increases with exercise or other  physical activity. The patient has no complaints of palpitations, irregular heart beats, chest pain, or chest pressure.   Gastrointestinal: Anna Parker is not having much belly hunger. She does sometimes have an upset stomach depending upon what foods are served in her dining facility. Bowel movents seem normal. The patient has no complaints of acid reflux, upset stomach, stomach aches or pains, diarrhea, or constipation.  Legs: Muscle mass and strength seem normal. There are no complaints of numbness, tingling, burning, or pain. No edema is noted.  Feet: There are no complaints of numbness, tingling, burning, or pain. No edema is noted. Neurologic: There are no recognized problems with muscle movement and strength, sensation, or coordination. GYN: Her LMP was in this past week. Menses occur regularly now and are not unusually heavy.  She is still on OCPs.  Skin: Since starting OCPs she does not have as much facial hair, but she does shave the corners of her mouth about once a week. The abdominal hair is much less.   PAST MEDICAL, FAMILY, AND SOCIAL HISTORY  Past Medical History:  Diagnosis Date  . Abnormal uterine bleeding   . Amenorrhea   . Central precocious puberty ([place])   . PCOS (polycystic ovarian syndrome)     Family History  Problem Relation Age of Onset  . Asthma Mother   . Depression Father   . Hypertension Maternal Grandmother   . Cancer Maternal Grandmother        Endometrial ca  . Thyroid disease Maternal Grandmother        hypothyroid  . Hypertension Maternal Grandfather   . Cancer Maternal Grandfather        Stage IV pancreatic CA  . Hyperlipidemia Paternal Grandmother   . Seizures Sister 2       1 seizure--unknown cause--none since   S  Current Outpatient Medications:  .  drospirenone-ethinyl estradiol (YASMIN,ZARAH,SYEDA) 3-0.03 MG tablet, Take 1 tablet by mouth daily., Disp: 84 tablet, Rfl: 3 .  metFORMIN (GLUCOPHAGE) 500 MG tablet, Take one tablet, twice daily,  Disp: 180 tablet, Rfl: 3 .  ranitidine (ZANTAC) 150 MG tablet, TAKE 1 TABLET (150 MG TOTAL) BY MOUTH 2 (TWO) TIMES DAILY., Disp: 180 tablet, Rfl: 3 .  acetaminophen (TYLENOL) 650 MG CR tablet, Take 650 mg by mouth every 8 (eight) hours as needed for pain., Disp: , Rfl:  .  fluticasone (FLONASE) 50 MCG/ACT nasal spray, Place 1 spray into both nostrils daily., Disp: , Rfl:   Allergies as of 08/17/2018 - Review Complete 08/17/2018  Allergen Reaction Noted  . Amoxicillin-pot clavulanate  02/15/2011  . Lupron depot [leuprolide] Hives 10/28/2016  . Nembutal [pentobarbital sodium]  02/15/2011     reports that she has never smoked. She has never used smokeless tobacco. She reports current alcohol use. She reports that she does not use drugs. Pediatric History  Patient Parents  . Heinze,[name] (Mother)   Other Topics Concern  . Not on file  Social History Narrative   Is in 9th grade at [employer] at [place] with parents and younger sister,  2 frogs 2 cats   School: She is now a sophomore at [employer] in [place], [place].  She is pre-med. She wants to take nursing courses now.   Physical activities: She has not been very physically active.  Primary Care Provider: Patient, No Pcp Per  REVIEW OF SYSTEMS: There are no other significant problems involving Najai's other body systems.    Objective:  Objective  Vital Signs:  BP 118/74   Pulse 90   Ht 5' 6.69" (1.694 m)   Wt 182 lb 3.2 oz (82.6 kg)   BMI 28.80 kg/m  Blood pressure percentiles are not available for patients who are 18 years or older.   Ht Readings from Last 3 Encounters:  08/17/18 5' 6.69" (1.694 m) (83 %, Z= 0.94)*  04/05/18 5' 7.17" (1.706 m) (87 %, Z= 1.13)*  02/08/18 5' 6.5" (1.689 m) (81 %, Z= 0.87)*   * Growth percentiles are based on CDC (Girls, 2-20 Years) data.   Wt Readings from Last 3 Encounters:  08/17/18 182 lb 3.2 oz (82.6 kg) (95 %, Z= 1.63)*  04/05/18 181 lb 9.6 oz (82.4 kg) (95 %,  Z= 1.63)*  02/08/18 180 lb (81.6 kg) (95 %, Z= 1.61)*   * Growth percentiles are based on CDC (Girls, 2-20 Years) data.   HC Readings from Last 3 Encounters:  No data found for [facility]   Body surface area is 1.97 meters squared. 83 %ile (Z= 0.94) based on CDC (Girls, 2-20 Years) Stature-for-age data based on Stature recorded on 08/17/2018. 95 %ile (Z= 1.63) based on CDC (Girls, 2-20 Years) weight-for-age data using vitals from 08/17/2018.    PHYSICAL EXAM:  Constitutional: The patient appears healthy and a bit slimmer. Her height has plateaued at the 82.60%. She has gained 0.5 pounds since her last visit. Her weight is at the 94.87%. Her BMI has decreased to the 91.17%. She is bright and alert and looks great today. She is very mature.   Head: The head is normocephalic. Face: The face appears normal. There are no obvious dysmorphic features. She still has grade 2 sideburns.  Eyes: The eyes appear to be normally formed and spaced. Gaze is conjugate. There is no obvious arcus or proptosis. Moisture appears normal. Ears: The ears are normally placed and appear externally normal. Mouth: The oropharynx and tongue appear normal. Dentition appears to be normal for age. Oral moisture is normal. There is no oral  hyperpigmentation.  Neck: The neck appears to be visibly normal. The thyroid gland has shrunk back to normal size. The consistency of the lobes is normal. The thyroid gland is not tender to palpation. Lungs: The lungs are clear to auscultation. Air movement is good. Heart: Heart rate and rhythm are regular. Heart sounds S1 and S2 are normal. I did not appreciate any pathologic cardiac murmurs. Abdomen: The abdomen is enlarged. Bowel sounds are normal. There is no obvious hepatomegaly, splenomegaly, or other mass effect.  Arms: Muscle size and bulk are normal for age. Hands: There is no obvious tremor. Phalangeal and metacarpophalangeal joints are normal. Palmar muscles are normal for age.  Palmar skin is normal. Palmar moisture is also normal. There is no palmar hyperpigmentation. She has no pallor of her fingernails today. Legs: Muscles appear normal for age.  Neurologic: Strength is normal for age in both the upper and lower extremities. Muscle tone is normal. Sensation to touch is normal in both legs.    LAB DATA:   Results for orders placed or  performed in visit on 08/17/18 (from the past 672 hour(s))  POCT Glucose (Device for Home Use)   Collection Time: 08/17/18  1:14 PM  Result Value Ref Range   Glucose Fasting, POC     POC Glucose 101 (A) 70 - 99 mg/dl  POCT glycosylated hemoglobin (Hb A1C)   Collection Time: 08/17/18  1:21 PM  Result Value Ref Range   Hemoglobin A1C 5.0 4.0 - 5.6 %   HbA1c POC (<> result, manual entry)     HbA1c, POC (prediabetic range)     HbA1c, POC (controlled diabetic range)     Labs 08/17/18: HbA1c 5.0%, CBG 101  Labs 03/718: HbA1c 5.1%, CBG 107; TSH 1.49, free T4 1.2, fre T3 3.3; CMP normal; CBC  Labs 01/28/17: HbA1c 4.9%, CBG 88  Labs 01/12/17: TSH 1.49, free T4 1.1, free T3 3.0  Labs 10/20/16: HbA1c 5.1%, CBG 88  Labs 07/19/16: HbA1c 5.2%  Labs 01/14/16: HbA1c 5.0%  Labs 01/03/16: TSH 1.58, free T4 1.1, free T3 2.9; CBC normal; iron 56  Labs 10/14/15: HbA1c 4.8%  Labs 07/14/15: HbA1c 5.2%  Labs 04/17/15: CMP normal; IGF-1 279, IGFBP-3 5.1  Labs 02/18/15: TSH 1.859, free T4 0.90, free T3 3.5; LH 16, FSH 6.4, testosterone 62, estradiol 45.9; ACTH 18 (normal 9-57), cortisol 12.4 (normal 4.2-22.4); prolactin 7.0; CBC normal; iron 118 (normal 42-145)  IMAGING:   MRI 12/28/15: "Stable 6 mm hyperintense cystic lesion in the posterior sella. 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 There is no expansion of the sella. Pituitary neoplasm is considered unlikely. Calcification or ossification along the posterior meningeal falx. This has not significantly changed. There is no enhancement. This  most likely represents benign ossification rather than a meningioma. Recommend 2 year follow-up to assure stability of this cyst given the patient's age. If the cyst remains stable following puberty, further follow-up may not be necessary.   Assessment and Plan:  Assessment  ASSESSMENT:  1. History of pituitary cyst/abnormal enhancement/brain cyst:   A. This area was smaller on her MRI in 2015 than it was in 2006 and 2008. Dr. [name], a very experienced neuroradiologist, felt that this area did not represent a pituitary tumor per se. She did not have any excessive pituitary hormone secretions. It was highly unlikely that this area was causing her fatigue or oligomenorrhea. However, Dr. [name] also mentioned an area of calcification of the mid-to-posterior falx and recommended follow up.  B. In 2017 her MRI was essentially unchanged. Dr. Jobe Igo was quite comfortable that the 6.5 mm lesion was a Rathke's cleft cyst. He was also comfortable with the ossifications being benign.   C. As can be seen in her labs from June and August 2016, this lesion was non-functional, that is, it was not producing any excess amounts of the six anterior pituitary hormones. This lesion was also definitely posterior to he adenohypophysis, not part of the pituitary gland as her grand uncle's tumor was said to be.  D. Maternal grand uncle had a pituitary adenoma that affected his optic nerves.  E. Thus far there has not been any evidence of clinical problems.  2. Overweight: This problem is a little worse.  3. Goiter: Her thyroid gland has shrunk back to normal size. The process of waxing and waning of thyroid gland size is c/w evolving Hashimoto's thyroiditis. She was euthyroid in May 2017 and again in May 2018. She was mid-euthyroid in August 4. Oligomenorrhea/PCOS/elevated testosterone, hirsutism:   A. The  oligomenorrhea resolved with weight loss, but may recur with weight gain.   B. The oligomenorrhea was likely due to her  obesity and PCOS/Stein-Leventhal Syndrome (SLS). Her LH/FSH ratio was elevated in June 2016, c/w PCOS/SLS. Her testosterone/estradiol ratio was similarly elevated, also c/w PCOS/SLS. Her prolactin was normal.    C. Her facial hair and abdominal hair have markedly decreased since starting OCPs. Mom has the same problem, as does mom's mother. Minh's hirsutism is not apparent today.  5. Fatigue, other: This problem has resolved. Her TFTs, CBC, iron level, ACTH, and cortisol were all normal in June 2016. We did not find a hormonal cause of her fatigue.  6. Dyspepsia: She has much less dyspepsia since starting metformin and ranitidine and drinking 8 glasses of water per day.  7. Pallor: Resolved. Her CBC and iron concentration were normal in May 2017.  She is not pallid today. 8. Abnormal MCH/abnormal red blood cell: Her MCH was low in August, but all of her other RBC indices were norma. She may be developing iron deficiency again.   PLAN:  1. Diagnostic: HbA1c and CBG today. CBC and iron today. Also reviewed past MRI results.  Repeat TFTs in 12 months. I offered to repeat her MRI now and she wants to do so. I ordered an MRI 2. Therapeutic: Continue metformin, 500 mg, twice daily. Stop Zantac or Sandoz brand of ranitidine. Otherwise, continue ranitidine, 150 mg, twice daily. Continue to Eat Right. Try to exercise for one hour per day.   3. Patient education: I reviewed her medical problems, to include her obesity, PCOS, elevated testosterone, female hirsutism, and the apparent Rathke's cleft cyst. I discussed the fact that obesity is a major risk factor for developing T2DM, hyperlipidemias, cardiovascular disease, and cancer. We also discussed her goiter and Hashimoto's disease. We discussed the effects of OCPs on her facial hair issue. Deepika asked appropriate questions and seemed very pleased with discussion and plan.  4. Follow-up: 6 months    Level of Service: This visit lasted in excess of 60 minutes.  More than 50% of the visit was devoted to counseling.  Tillman Sers, MD, CDE Pediatric and Adult Endocrinology

## 2018-08-18 LAB — CBC WITH DIFFERENTIAL/PLATELET
Absolute Monocytes: 656 cells/uL (ref 200–950)
BASOS PCT: 0.9 %
Basophils Absolute: 71 cells/uL (ref 0–200)
EOS PCT: 1.3 %
Eosinophils Absolute: 103 cells/uL (ref 15–500)
HCT: 40.4 % (ref 35.0–45.0)
Hemoglobin: 13.1 g/dL (ref 11.7–15.5)
Lymphs Abs: 2346 cells/uL (ref 850–3900)
MCH: 26.7 pg — ABNORMAL LOW (ref 27.0–33.0)
MCHC: 32.4 g/dL (ref 32.0–36.0)
MCV: 82.4 fL (ref 80.0–100.0)
MONOS PCT: 8.3 %
MPV: 12.1 fL (ref 7.5–12.5)
NEUTROS ABS: 4724 {cells}/uL (ref 1500–7800)
Neutrophils Relative %: 59.8 %
PLATELETS: 212 10*3/uL (ref 140–400)
RBC: 4.9 10*6/uL (ref 3.80–5.10)
RDW: 13.7 % (ref 11.0–15.0)
TOTAL LYMPHOCYTE: 29.7 %
WBC: 7.9 10*3/uL (ref 3.8–10.8)

## 2018-08-18 LAB — IRON: Iron: 34 ug/dL (ref 27–164)

## 2018-08-21 ENCOUNTER — Encounter (INDEPENDENT_AMBULATORY_CARE_PROVIDER_SITE_OTHER): Payer: Self-pay | Admitting: *Deleted

## 2018-08-29 ENCOUNTER — Telehealth (INDEPENDENT_AMBULATORY_CARE_PROVIDER_SITE_OTHER): Payer: Self-pay | Admitting: "Endocrinology

## 2018-08-29 DIAGNOSIS — G930 Cerebral cysts: Secondary | ICD-10-CM

## 2018-08-29 DIAGNOSIS — E236 Other disorders of pituitary gland: Secondary | ICD-10-CM

## 2018-08-29 NOTE — Telephone Encounter (Signed)
Call to [facility] at [facility] reports needs to determine if the MR Venogram ordered on this patient is to be with and without contrast or just without contrast

## 2018-08-29 NOTE — Telephone Encounter (Signed)
°  Who's calling (name and relationship to patient) :  Imaging  Best contact number: [phone] Provider they see: Tobe Sos Reason for call: Please call concerning upcoming MRI for patient.      PRESCRIPTION REFILL ONLY  Name of prescription:  Pharmacy:

## 2018-08-29 NOTE — Telephone Encounter (Signed)
Call to [place] at [facility] to confirm if a PA is required for MRI.  Return call from [facility] reports that PA is not required. Confirmed with Dr. Tobe Sos that he wants an MRI of Brain with and without contrast not a Venogram.  Call back to [facility] imaging spoke with Mardene Celeste confirmed with her the change of order and that a PA is not required. She reports yes can see the new order and procedure is scheduled

## 2018-09-04 ENCOUNTER — Other Ambulatory Visit: Payer: Self-pay | Admitting: "Endocrinology

## 2018-09-09 ENCOUNTER — Ambulatory Visit
Admission: RE | Admit: 2018-09-09 | Discharge: 2018-09-09 | Disposition: A | Discharge status: Home / Self Care | Payer: PRIVATE HEALTH INSURANCE | Source: Ambulatory Visit | Attending: "Endocrinology | Admitting: "Endocrinology

## 2018-09-09 DIAGNOSIS — G930 Cerebral cysts: Secondary | ICD-10-CM

## 2018-09-09 DIAGNOSIS — E236 Other disorders of pituitary gland: Secondary | ICD-10-CM

## 2018-09-09 MED ORDER — GENERIC EXTERNAL MEDICATION
10.00 | Status: DC
Start: ? — End: 2018-09-09

## 2018-09-09 MED ORDER — GADOBENATE DIMEGLUMINE 529 MG/ML IV SOLN
8.0000 mL | Freq: Once | INTRAVENOUS | Status: AC | PRN
  Administered 2018-09-09: 8 mL via INTRAVENOUS

## 2018-09-09 MED ORDER — SODIUM CHLORIDE 0.9 % IV SOLN
10.00 | INTRAVENOUS | Status: DC
Start: ? — End: 2018-09-09

## 2018-09-13 NOTE — Telephone Encounter (Signed)
Copied from [place] [id number]. Topic: Appointment Scheduling - Scheduling Inquiry for Clinic >> Sep 12, 2018 12:43 PM Sheran Luz wrote: Reason for CRM: Patient calling to inquire if Dr. Birdie Riddle would accept her as new patient stating that she was referred to Dr. Birdie Riddle by Dr. Tobe Sos (Endo). Please advise. >> Sep 12, 2018  1:39 PM Katina Dung, CMA wrote: Routing to provider to advise >> Sep 13, 2018  7:44 AM Midge Minium, MD wrote: Anna Parker to establish as she was referred by specialist

## 2018-09-13 NOTE — Telephone Encounter (Signed)
Spoke with patient on the phone to schedule new patient appt. Patient stated she needed a new patient appt by this Friday. Unable to get her in, patient stated that she will "call back during Spring Break to make an appt." Patient [place] of [place], unaware of when she will be home during spring break.

## 2018-09-13 NOTE — Telephone Encounter (Signed)
Copied from [place] [id number]. Topic: Appointment Scheduling - Scheduling Inquiry for Clinic >> Sep 12, 2018 12:43 PM Sheran Luz wrote: Reason for CRM: Patient calling to inquire if Dr. Birdie Riddle would accept her as new patient stating that she was referred to Dr. Birdie Riddle by Dr. Tobe Sos (Endo). Please advise. >> Sep 12, 2018  1:39 PM Katina Dung, CMA wrote: Routing to provider to advise >> Sep 13, 2018  7:44 AM Midge Minium, MD wrote: Madaline Brilliant to establish as she was referred by specialist

## 2018-11-15 ENCOUNTER — Encounter: Payer: Self-pay | Admitting: Family Medicine

## 2018-11-15 ENCOUNTER — Ambulatory Visit (INDEPENDENT_AMBULATORY_CARE_PROVIDER_SITE_OTHER): Payer: PRIVATE HEALTH INSURANCE | Admitting: Family Medicine

## 2018-11-15 ENCOUNTER — Other Ambulatory Visit: Payer: Self-pay

## 2018-11-15 VITALS — BP 116/80 | HR 71 | Temp 98.1°F | Resp 16 | Ht 67.0 in | Wt 187.1 lb

## 2018-11-15 DIAGNOSIS — E282 Polycystic ovarian syndrome: Secondary | ICD-10-CM

## 2018-11-15 DIAGNOSIS — G43909 Migraine, unspecified, not intractable, without status migrainosus: Secondary | ICD-10-CM | POA: Diagnosis not present

## 2018-11-15 DIAGNOSIS — E236 Other disorders of pituitary gland: Secondary | ICD-10-CM

## 2018-11-15 NOTE — Assessment & Plan Note (Signed)
New to provider, ongoing for pt.  Not currently requiring triptans as she is able to control w/ ibuprofen.  Will continue to follow and adjust tx prn.

## 2018-11-15 NOTE — Assessment & Plan Note (Signed)
New to provider, ongoing for pt.  This was the cause of her precocious puberty.  This may also be contributing to PCOS.  Pt currently asymptomatic but this will require ongoing monitoring.  Will follow.

## 2018-11-15 NOTE — Patient Instructions (Signed)
Schedule your complete physical in 6 months Keep up the good work on healthy diet and regular exercise- you can do it! Call with any questions or concerns Welcome!  We're glad to have you!!!

## 2018-11-15 NOTE — Assessment & Plan Note (Signed)
New to provider, ongoing for pt.  On Metformin and Yaz to improve sxs.  Followed by Endocrinology.  Will follow along and assist as able.

## 2018-11-15 NOTE — Progress Notes (Signed)
   Subjective:    Patient ID: Anna Parker, female    DOB: Aug 09, 1996, 23 y.o.   MRN: [id number]  HPI New to establish.  Previous MD- Dr Sheran Lawless  Peds Endo- Dr Tobe Sos  Migraines- pt has had 2-3 migraines since January.  Has been battling for 2-3 yrs.  Has not taken a repeat dose of Imitrex- able to use Ibuprofen.  + family hx of migraines.  Pt notes increased headaches w/ increased screen time.    Precocious puberty- showed signs of puberty at age 68.  Found benign tumor in posterior pituitary.  Has been following w/ Endo since age 79.  Stopped having periods at age 68- was found to have PCOS.  Started on Metformin and Yaz.  Overall is feeling 'pretty good' w/ exception of fatigue.  Pt reports she has been out of routine w/ all of the school closings.   Review of Systems For ROS see HPI     Objective:   Physical Exam Vitals signs reviewed.  Constitutional:      General: She is not in acute distress.    Appearance: She is well-developed. She is not ill-appearing.  HENT:     Head: Normocephalic and atraumatic.  Eyes:     Conjunctiva/sclera: Conjunctivae normal.     Pupils: Pupils are equal, round, and reactive to light.  Neck:     Musculoskeletal: Normal range of motion and neck supple.     Thyroid: No thyromegaly.  Cardiovascular:     Rate and Rhythm: Normal rate and regular rhythm.     Heart sounds: Normal heart sounds. No murmur.  Pulmonary:     Effort: Pulmonary effort is normal. No respiratory distress.     Breath sounds: Normal breath sounds.  Abdominal:     General: There is no distension.     Palpations: Abdomen is soft.     Tenderness: There is no abdominal tenderness.  Lymphadenopathy:     Cervical: No cervical adenopathy.  Skin:    General: Skin is warm and dry.  Neurological:     Mental Status: She is alert and oriented to person, place, and time.  Psychiatric:        Behavior: Behavior normal.           Assessment & Plan:

## 2018-12-01 ENCOUNTER — Other Ambulatory Visit (INDEPENDENT_AMBULATORY_CARE_PROVIDER_SITE_OTHER): Payer: Self-pay | Admitting: *Deleted

## 2018-12-01 MED ORDER — OMEPRAZOLE 20 MG PO CPDR: 20.0000 mg | DELAYED_RELEASE_CAPSULE | Freq: Two times a day (BID) | ORAL | 1 refills | Status: DC

## 2019-01-29 ENCOUNTER — Other Ambulatory Visit: Payer: Self-pay

## 2019-01-29 ENCOUNTER — Encounter: Payer: Self-pay | Admitting: Family Medicine

## 2019-01-29 ENCOUNTER — Ambulatory Visit (INDEPENDENT_AMBULATORY_CARE_PROVIDER_SITE_OTHER): Payer: PRIVATE HEALTH INSURANCE | Admitting: Family Medicine

## 2019-01-29 VITALS — Ht 67.0 in | Wt 180.0 lb

## 2019-01-29 DIAGNOSIS — H938X2 Other specified disorders of left ear: Secondary | ICD-10-CM | POA: Diagnosis not present

## 2019-01-29 NOTE — Progress Notes (Signed)
   Virtual Visit via Video   I connected with patient on 01/29/19 at  4:00 PM EDT by a video enabled telemedicine application and verified that I am speaking with the correct person using two identifiers.  Location patient: Home Location provider: Acupuncturist, Office Persons participating in the virtual visit: Patient, Provider, [place] (Katie N)  I discussed the limitations of evaluation and management by telemedicine and the availability of in person appointments. The patient expressed understanding and agreed to proceed.  Subjective:   HPI:   Ear pressure- developed 'whooshing' sound in L ear last evening.  'Rhythmic, continual breathing sound'.  Sound has decreased considerably, only mild fullness today.  + PND.  Was out in yard all day yesterday, took Aller-Tec last night.  No HAs or dizziness.  No drainage, no recent water exposure.  ROS:   See pertinent positives and negatives per HPI.  Patient Active Problem List   Diagnosis Date Noted  . Migraine 11/15/2018  . Elevated testosterone level in female 01/30/2017  . Female hirsutism 01/30/2017  . PCOS (polycystic ovarian syndrome) 01/30/2017  . Mass of posterior pituitary ([place]) 01/14/2016  . Dyspepsia 03/26/2015  . Obesity 02/17/2015  . Oligomenorrhea 02/17/2015  . Other fatigue 02/17/2015  . Goiter 02/17/2015  . Cavus deformity of foot 10/30/2013  . Abnormality of gait 10/30/2013    Social History   Tobacco Use  . Smoking status: Never Smoker  . Smokeless tobacco: Never Used  Substance Use Topics  . Alcohol use: Yes    Comment: occasionally    Current Outpatient Medications:  .  drospirenone-ethinyl estradiol (YASMIN,ZARAH,SYEDA) 3-0.03 MG tablet, Take 1 tablet by mouth daily., Disp: 84 tablet, Rfl: 3 .  fluticasone (FLONASE) 50 MCG/ACT nasal spray, Place 1 spray into both nostrils daily., Disp: , Rfl:  .  metFORMIN (GLUCOPHAGE) 500 MG tablet, Take one tablet, twice daily, Disp: 180 tablet, Rfl: 3 .   omeprazole (PRILOSEC) 20 MG capsule, Take 1 capsule (20 mg total) by mouth 2 (two) times daily before a meal., Disp: 180 capsule, Rfl: 1 .  SUMAtriptan (IMITREX) 100 MG tablet, Take by mouth., Disp: , Rfl:   Allergies  Allergen Reactions  . Amoxicillin-Pot Clavulanate   . Lupron Depot [Leuprolide] Hives  . Nembutal [Pentobarbital Sodium]     Objective:   Ht 5\' 7"  (1.702 m)   Wt 180 lb (81.6 kg)   BMI 28.19 kg/m   AAOx3, NAD NCAT, EOMI No obvious CN deficits Coloring WNL Pt is able to speak clearly, coherently without shortness of breath or increased work of breathing.  Thought process is linear.  Mood is appropriate.   Assessment and Plan:   L ear fullness- new.  No pain, no drainage.  Whooshing sound has greatly improved since last night.  Pt has hx of seasonal allergies.  Sxs began after day in the yard and improved w/ use of Aller-Tec.  Suspect this is eustachian tube dysfxn.  Encouraged daily use of antihistamine and nasal steroid until sxs resolve.  If no improvement will need in office visit.  Pt expressed understanding and is in agreement w/ plan.    Annye Asa, MD 01/29/2019

## 2019-02-12 ENCOUNTER — Other Ambulatory Visit (HOSPITAL_COMMUNITY)
Admission: RE | Admit: 2019-02-12 | Discharge: 2019-02-12 | Disposition: A | Discharge status: Home / Self Care | Payer: PRIVATE HEALTH INSURANCE | Source: Ambulatory Visit | Attending: Obstetrics and Gynecology | Admitting: Obstetrics and Gynecology

## 2019-02-12 ENCOUNTER — Ambulatory Visit (INDEPENDENT_AMBULATORY_CARE_PROVIDER_SITE_OTHER): Payer: PRIVATE HEALTH INSURANCE | Admitting: Obstetrics and Gynecology

## 2019-02-12 ENCOUNTER — Other Ambulatory Visit: Payer: Self-pay

## 2019-02-12 ENCOUNTER — Encounter: Payer: Self-pay | Admitting: Obstetrics and Gynecology

## 2019-02-12 VITALS — BP 98/76 | HR 76 | Temp 97.6°F | Resp 12 | Ht 66.0 in | Wt 190.0 lb

## 2019-02-12 DIAGNOSIS — Z01419 Encounter for gynecological examination (general) (routine) without abnormal findings: Secondary | ICD-10-CM | POA: Diagnosis not present

## 2019-02-12 DIAGNOSIS — Z113 Encounter for screening for infections with a predominantly sexual mode of transmission: Secondary | ICD-10-CM | POA: Diagnosis present

## 2019-02-12 MED ORDER — DROSPIRENONE-ETHINYL ESTRADIOL 3-0.03 MG PO TABS: 1.0000 | ORAL_TABLET | Freq: Every day | ORAL | 3 refills | Status: DC

## 2019-02-12 NOTE — Patient Instructions (Signed)

## 2019-02-12 NOTE — Progress Notes (Signed)
23 y.o. G68P0000 Single Caucasian female here for annual exam.    Doing well with her birth control pills.  Has a period with them.  Remembers to take them on time.   Now sexually active for the first time in January, 2020.  Used a condom.   Is starting a Mediterranean diet. Is doing bike rides every week.   She has migraine headaches.  She has a deviated septum.  She has temporal HA once a week.  She denies light sensitivity or nausea.  She denies aura.  Studying at home during the pandemic.  She is waiting to her if she will be studying on line or not.  Interested in nursing and anesthesia.   PCP: Annye Asa, MD    Patient's last menstrual period was 01/30/2019.           Sexually active: No.  The current method of family planning is Yasmin.    Exercising: Yes.    bike rides Smoker:  no  Health Maintenance: Pap:  n/a History of abnormal Pap: n/a TDaP:  Up to date Gardasil:   yes Screening Labs:  Endocrinologist   reports that she has never smoked. She has never used smokeless tobacco. She reports current alcohol use. She reports previous drug use. Drug: Marijuana.  Past Medical History:  Diagnosis Date  . Abnormal uterine bleeding   . Amenorrhea   . Central precocious puberty ([place])   . Migraine headache without aura   . PCOS (polycystic ovarian syndrome)   . Pituitary adenoma [facility])     Past Surgical History:  Procedure Laterality Date  . left ankle fracture Left 2003  . left ankle repair Left 2016  . TONSILLECTOMY AND ADENOIDECTOMY    . TYMPANOPLASTY      Current Outpatient Medications  Medication Sig Dispense Refill  . drospirenone-ethinyl estradiol (YASMIN) 3-0.03 MG tablet Take 1 tablet by mouth daily. 3 Package 3  . fluticasone (FLONASE) 50 MCG/ACT nasal spray Place 1 spray into both nostrils daily.    . metFORMIN (GLUCOPHAGE) 500 MG tablet Take one tablet, twice daily 180 tablet 3  . omeprazole (PRILOSEC) 20 MG capsule Take 1 capsule (20 mg  total) by mouth 2 (two) times daily before a meal. 180 capsule 1  . SUMAtriptan (IMITREX) 100 MG tablet Take by mouth.     No current facility-administered medications for this visit.     Family History  Problem Relation Age of Onset  . Asthma Mother   . ADD / ADHD Mother   . Arthritis Mother   . Depression Father   . Hyperlipidemia Father   . Sleep apnea Father   . Arthritis Father   . Hypertension Maternal Grandmother   . Cancer Maternal Grandmother        Endometrial ca  . Thyroid disease Maternal Grandmother        hypothyroid  . Kidney failure Maternal Grandmother   . Coronary artery disease Maternal Grandmother   . Heart failure Maternal Grandmother   . Atrial fibrillation Maternal Grandmother   . Hypertension Maternal Grandfather   . Cancer Maternal Grandfather        Stage IV pancreatic CA  . Hyperlipidemia Paternal Grandmother   . Hypothyroidism Paternal Grandmother   . Depression Paternal Grandmother   . Macular degeneration Paternal Grandmother   . Cataracts Paternal Grandmother   . Arthritis Paternal Grandfather   . Depression Paternal Grandfather   . Cancer Paternal Grandfather        skin cancer  .  Seizures Sister 2       1 seizure--unknown cause--none since  . Polycystic ovary syndrome Sister     Review of Systems  Constitutional: Negative.   HENT: Negative.   Eyes: Negative.   Respiratory: Negative.   Cardiovascular: Negative.   Gastrointestinal: Negative.   Endocrine: Negative.   Genitourinary: Negative.   Musculoskeletal: Negative.   Skin: Negative.   Allergic/Immunologic: Negative.   Neurological: Negative.   Hematological: Negative.   Psychiatric/Behavioral: Negative.     Exam:   BP 98/76 (BP Location: Left Arm, Patient Position: Sitting, Cuff Size: Normal)   Pulse 76   Temp 97.6 F (36.4 C) (Temporal)   Resp 12   Ht 5\' 6"  (1.676 m)   Wt 190 lb (86.2 kg)   LMP 01/30/2019   BMI 30.67 kg/m     General appearance: alert,  cooperative and appears stated age Head: normocephalic, without obvious abnormality, atraumatic Neck: no adenopathy, supple, symmetrical, trachea midline and thyroid normal to inspection and palpation Lungs: clear to auscultation bilaterally Breasts: normal appearance, no masses or tenderness, No nipple retraction or dimpling, No nipple discharge or bleeding, No axillary adenopathy Heart: regular rate and rhythm Abdomen: soft, non-tender; no masses, no organomegaly Extremities: extremities normal, atraumatic, no cyanosis or edema Skin: skin color, texture, turgor normal. No rashes or lesions Lymph nodes: cervical, supraclavicular, and axillary nodes normal. Neurologic: grossly normal  Pelvic: External genitalia:  no lesions              No abnormal inguinal nodes palpated.              Urethra:  normal appearing urethra with no masses, tenderness or lesions              Bartholins and Skenes: normal                 Vagina: normal appearing vagina with normal color and discharge, no lesions              Cervix: no lesions              Pap taken: No. Bimanual Exam:  Uterus:  normal size, contour, position, consistency, mobility, non-tender              Adnexa: no mass, fullness, tenderness        Chaperone was present for exam.  Assessment:   Well woman visit with normal exam. Hx central precocious puberty.  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  PCOS. Migraine without aura.  Plan: Mammogram screening age 76. Self breast awareness reviewed. Pap next year.  Guidelines for Calcium, Vitamin D, regular exercise program including cardiovascular and weight bearing exercise. Refill of Yasmin for one year.  STD testing.  Condoms discussed.  Follow up annually and prn.   After visit summary provided.

## 2019-02-14 LAB — CERVICOVAGINAL ANCILLARY ONLY
Chlamydia: NEGATIVE
Neisseria Gonorrhea: NEGATIVE
Trichomonas: NEGATIVE

## 2019-02-14 LAB — HEP, RPR, HIV PANEL
HIV Screen 4th Generation wRfx: NONREACTIVE
Hepatitis B Surface Ag: NEGATIVE
RPR Ser Ql: NONREACTIVE

## 2019-02-14 LAB — HEPATITIS C ANTIBODY: Hep C Virus Ab: <0.1 s/co ratio (ref 0.0–0.9)

## 2019-02-16 ENCOUNTER — Ambulatory Visit (INDEPENDENT_AMBULATORY_CARE_PROVIDER_SITE_OTHER): Payer: PRIVATE HEALTH INSURANCE | Admitting: "Endocrinology

## 2019-03-21 ENCOUNTER — Ambulatory Visit (INDEPENDENT_AMBULATORY_CARE_PROVIDER_SITE_OTHER): Payer: PRIVATE HEALTH INSURANCE | Admitting: "Endocrinology

## 2019-03-21 ENCOUNTER — Other Ambulatory Visit: Payer: Self-pay

## 2019-03-21 ENCOUNTER — Encounter (INDEPENDENT_AMBULATORY_CARE_PROVIDER_SITE_OTHER): Payer: Self-pay | Admitting: "Endocrinology

## 2019-03-21 VITALS — BP 108/72 | HR 88 | Ht 66.61 in | Wt 189.0 lb

## 2019-03-21 DIAGNOSIS — E063 Autoimmune thyroiditis: Secondary | ICD-10-CM

## 2019-03-21 DIAGNOSIS — E663 Overweight: Secondary | ICD-10-CM

## 2019-03-21 DIAGNOSIS — R5383 Other fatigue: Secondary | ICD-10-CM

## 2019-03-21 DIAGNOSIS — R718 Other abnormality of red blood cells: Secondary | ICD-10-CM

## 2019-03-21 DIAGNOSIS — E236 Other disorders of pituitary gland: Secondary | ICD-10-CM

## 2019-03-21 DIAGNOSIS — E6609 Other obesity due to excess calories: Secondary | ICD-10-CM | POA: Diagnosis not present

## 2019-03-21 DIAGNOSIS — E049 Nontoxic goiter, unspecified: Secondary | ICD-10-CM | POA: Diagnosis not present

## 2019-03-21 LAB — POCT GLYCOSYLATED HEMOGLOBIN (HGB A1C): Hemoglobin A1C: 5.2 % (ref 4.0–5.6)

## 2019-03-21 LAB — POCT GLUCOSE (DEVICE FOR HOME USE): Glucose Fasting, POC: 117 mg/dL — AB (ref 70–99)

## 2019-03-21 NOTE — Patient Instructions (Signed)
Follow up visit in 5-6 months. Please take either a One-a-Day for Women or a Centrum for Women.

## 2019-03-21 NOTE — Progress Notes (Signed)
Subjective:  Subjective  Patient Name: Dareth Andrew Date of Birth: [birth year]  MRN: [id number]  Braydee Shimkus  presents to the office today for follow-up evaluation and management of her history of goiter, oligomenorrhea, elevated testosterone, PCOS, hirsutism, fatigue, dyspepsia, and pituitary cyst/area of differential enhancement.  HISTORY OF PRESENT ILLNESS:   Dolora is a 23 y.o. Caucasian young lady.   Jerolyn was accompanied by her sister.  19. Tamisha is a 25 y.o. young lady who was followed from an early age for precocious  pubertal development.   A. The child started [facility] agonist therapy as a very young child. I took care of her from 2006 to early 2012. At about that time she stopped her [facility] agonist therapy.    B. As part of her evaluation for precocity, Brynlyn had an MRI scan of her brain, performed with and without contrast in 2006, which revealed an "oval area of differential enhancement measuring 5.8 x 2.9 x 3.2 mm which was c/w a pituitary cyst, Rathke's cleft cyst, or small pituitary microadenoma".  There was also a "tiny area of increased signal on precontrast T1-weighted images along the mid to lower infundibulum".   C. A follow up brain MRI scan was performed with and without contrast in 2008. The radiologist stated that, "The previously identified cystic structure has involuted and the gland is now normal. There was a tiny questioned focus of T1 shortening on precontrast images near the infundibulum. I believe this are too has normalized. It may have represented a partial volume averaging of the clivus."   2. On 01/17/14 Valmai returned to our clinic, after a 3-year hiatus, for re-evaluation of her pituitary lesion and was seen by Dr. Baldo Ash. [name] had been discovered to have a benign tumor of the pituitary that had damaged his vision. Dr. Everitt Amber, our pediatric ophthalmologist, felt that she deserved an updated pituitary evaluation given the family history and  her personal history of pituitary findings. Mom also stated that they were told at their last endocrine visit that she would need repeat imaging in 5 years.   A. She had achieved a normal adult height and was at a healthy weight for her height. She reported normal menses every 28-36 days. She had not had significant changes in her vision, severe headaches, or galactorrhea.   B. A follow up MRI, with and without contrast, was performed on 01/27/14. Dr. [name] saw a 5 mm area of differential enhancement posterior to the insertion of the infundibulum. This area was smaller than in 2006 and was less intense than it was on the pre-contrast images from the MRI in 2008. 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 The appearance is not typical for pituitary microadenoma given the location posterior to the adenohypophysis.".   C. Subsequent follow up MRI on 12/28/15 showed that she had a 6.5 mm lesion posterior to the adenohypophysis. 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   3. Annalaya's last [facility] visit occurred on 08/17/18. In the interim she has been healthy. At that visit I continued her metformin 500 mg twice daily and her ranitidine.   A. She has been trying to Eat Right, but has been exercising less.   B. Her energy level is usually "really good".   C. She has been eating more salads, veggies, and citrus fruits and taking in less sodas and carbs. She has been swimming and walking.   D. She is  taking metformin, 500 mg, twice daily. She is now taking omeprazole, 20 mg, twice daily, but often forgets it.    E. She has not had many headaches since her last vi, but is vague about how much walking she has been doing. sit.    4. Pertinent Review of Systems:  Constitutional: The patient feels "really good". She is doing well emotionally.  Eyes: Her eye glasses and contacts are working well. There are no other recognized eye problems.  Neck: The patient has no  complaints of anterior neck swelling, soreness, tenderness, pressure, discomfort, or difficulty swallowing.   Heart: Heart rate increases with exercise or other physical activity. The patient has no complaints of palpitations, irregular heart beats, chest pain, or chest pressure.   Gastrointestinal: Laverle is not having much belly hunger. Bowel movents seem normal. The patient has no complaints of acid reflux, upset stomach, stomach aches or pains, diarrhea, or constipation.  Legs: Muscle mass and strength seem normal. There are no complaints of numbness, tingling, burning, or pain. No edema is noted.  Feet: There are no complaints of numbness, tingling, burning, or pain. No edema is noted. Neurologic: There are no recognized problems with muscle movement and strength, sensation, or coordination. GYN: Her LMP was 2-3 weeks ago. Menses occur regularly now and are not unusually heavy.  She is still on OCPs.  Skin: Since starting OCPs she does not have as much facial hair, so she does not often have to shave the corners of her mouth. The abdominal hair is much less.   PAST MEDICAL, FAMILY, AND SOCIAL HISTORY  Past Medical History:  Diagnosis Date  . Abnormal uterine bleeding   . Amenorrhea   . Central precocious puberty ([place])   . Migraine headache without aura   . PCOS (polycystic ovarian syndrome)   . Pituitary adenoma ([place])     Family History  Problem Relation Age of Onset  . Asthma Mother   . ADD / ADHD Mother   . Arthritis Mother   . Depression Father   . Hyperlipidemia Father   . Sleep apnea Father   . Arthritis Father   . Hypertension Maternal Grandmother   . Cancer Maternal Grandmother        Endometrial ca  . Thyroid disease Maternal Grandmother        hypothyroid  . Kidney failure Maternal Grandmother   . Coronary artery disease Maternal Grandmother   . Heart failure Maternal Grandmother   . Atrial fibrillation Maternal Grandmother   . Hypertension Maternal Grandfather    . Cancer Maternal Grandfather        Stage IV pancreatic CA  . Hyperlipidemia Paternal Grandmother   . Hypothyroidism Paternal Grandmother   . Depression Paternal Grandmother   . Macular degeneration Paternal Grandmother   . Cataracts Paternal Grandmother   . Arthritis Paternal Grandfather   . Depression Paternal Grandfather   . Cancer Paternal Grandfather        skin cancer  . Seizures Sister 2       1 seizure--unknown cause--none since  . Polycystic ovary syndrome Sister    S  Current Outpatient Medications:  .  drospirenone-ethinyl estradiol (YASMIN) 3-0.03 MG tablet, Take 1 tablet by mouth daily., Disp: 3 Package, Rfl: 3 .  fluticasone (FLONASE) 50 MCG/ACT nasal spray, Place 1 spray into both nostrils daily., Disp: , Rfl:  .  metFORMIN (GLUCOPHAGE) 500 MG tablet, Take one tablet, twice daily, Disp: 180 tablet, Rfl: 3 .  omeprazole (PRILOSEC) 20 MG  capsule, Take 1 capsule (20 mg total) by mouth 2 (two) times daily before a meal., Disp: 180 capsule, Rfl: 1 .  SUMAtriptan (IMITREX) 100 MG tablet, Take by mouth., Disp: , Rfl:   Allergies as of 03/21/2019 - Review Complete 03/21/2019  Allergen Reaction Noted  . Amoxicillin-pot clavulanate  02/15/2011  . Lupron depot [leuprolide] Hives 10/28/2016  . Nembutal [pentobarbital sodium]  02/15/2011     reports that she has never smoked. She has never used smokeless tobacco. She reports current alcohol use. She reports previous drug use. Drug: Marijuana. Pediatric History  Patient Parents  . Brunelle,[name] (Mother)  . Moritz,Charles (Father)   Other Topics Concern  . Not on file  Social History Narrative   Is in 9th grade at [employer] at [place] with parents and younger sister, 2 frogs 2 cats   School: She will start her junior year at [employer] in [place], [place].  She is pre-med. She takes nursing courses now.  She may graduate one year early.  Physical activities: She has been more physically active.   Primary Care Provider: Midge Minium, MD  REVIEW OF SYSTEMS: There are no other significant problems involving Shirleyann's other body systems.    Objective:  Objective  Vital Signs:  BP 108/72   Pulse 88   Ht 5' 6.61" (1.692 m)   Wt 189 lb (85.7 kg)   BMI 29.95 kg/m  Growth percentile SmartLinks can only be used for patients less than 57 years old.   Ht Readings from Last 3 Encounters:  03/21/19 5' 6.61" (1.692 m)  02/12/19 5\' 6"  (1.676 m)  01/29/19 5\' 7"  (1.702 m)   Wt Readings from Last 3 Encounters:  03/21/19 189 lb (85.7 kg)  02/12/19 190 lb (86.2 kg)  01/29/19 180 lb (81.6 kg)   HC Readings from Last 3 Encounters:  No data found for [facility]   Body surface area is 2.01 meters squared. Facility age limit for growth percentiles is 20 years. Facility age limit for growth percentiles is 20 years.    PHYSICAL EXAM:  Constitutional: The patient appears healthy and a bit slimmer. Her height has plateaued. She has gained 7 pounds since her last visit. She is 59 pounds above her Ideal Body Weight of 130 pounds.  She is bright and alert and looks good today. She is very mature.   Head: The head is normocephalic. Face: The face appears normal. There are no obvious dysmorphic features. She still has grade 2 sideburns.  Eyes: The eyes appear to be normally formed and spaced. Gaze is conjugate. There is no obvious arcus or proptosis. Moisture appears normal. Ears: The ears are normally placed and appear externally normal. Mouth: The oropharynx and tongue appear normal. Dentition appears to be normal for age. Oral moisture is normal. There is no oral  hyperpigmentation.  Neck: The neck appears to be visibly normal. The thyroid gland has increased in size to about 21 grams. The right lobe is top normal in size, but the left lobe is larger. The consistency of the right lobes is normal, but the left lobe is more full.  The thyroid gland is not tender to palpation. Lungs: The lungs are  clear to auscultation. Air movement is good. Heart: Heart rate and rhythm are regular. Heart sounds S1 and S2 are normal. I did not appreciate any pathologic cardiac murmurs. Abdomen: The abdomen is enlarged. Bowel sounds are normal. There is no obvious hepatomegaly, splenomegaly, or other  mass effect.  Arms: Muscle size and bulk are normal for age. Hands: There is no obvious tremor. Phalangeal and metacarpophalangeal joints are normal. Palmar muscles are normal for age. Palmar skin is normal. Palmar moisture is also normal. There is no palmar hyperpigmentation. She has no pallor of her fingernails today. Legs: Muscles appear normal for age.  Neurologic: Strength is normal for age in both the upper and lower extremities. Muscle tone is normal. Sensation to touch is normal in both legs.    LAB DATA:   Results for orders placed or performed in visit on 03/21/19 (from the past 672 hour(s))  POCT Glucose (Device for Home Use)   Collection Time: 03/21/19  1:22 PM  Result Value Ref Range   Glucose Fasting, POC 117 (A) 70 - 99 mg/dL   POC Glucose    POCT glycosylated hemoglobin (Hb A1C)   Collection Time: 03/21/19  1:32 PM  Result Value Ref Range   Hemoglobin A1C 5.2 4.0 - 5.6 %   HbA1c POC (<> result, manual entry)     HbA1c, POC (prediabetic range)     HbA1c, POC (controlled diabetic range)     Labs 03/21/19: HbA1c 5.2%, CBG 117  Labs 08/17/18: HbA1c 5.0%, CBG 101; CBC normal except MCH 26,7 (ref 27-33); iron 34 (ref 27-164)   Labs 03/718: HbA1c 5.1%, CBG 107; TSH 1.49, free T4 1.2, free T3 3.3; CMP normal; CBC normal, except MCH 26.9 (ref 27-33)  Labs 01/28/17: HbA1c 4.9%, CBG 88  Labs 01/12/17: TSH 1.49, free T4 1.1, free T3 3.0  Labs 10/20/16: HbA1c 5.1%, CBG 88  Labs 07/19/16: HbA1c 5.2%  Labs 01/14/16: HbA1c 5.0%  Labs 01/03/16: TSH 1.58, free T4 1.1, free T3 2.9; CBC normal; iron 56  Labs 10/14/15: HbA1c 4.8%  Labs 07/14/15: HbA1c 5.2%  Labs 04/17/15: CMP normal; IGF-1 279,  IGFBP-3 5.1  Labs 02/18/15: TSH 1.859, free T4 0.90, free T3 3.5; LH 16, FSH 6.4, testosterone 62, estradiol 45.9; ACTH 18 (normal 9-57), cortisol 12.4 (normal 4.2-22.4); prolactin 7.0; CBC normal; iron 118 (normal 42-145)  IMAGING:   MRI 09/09/18: There is a 6 mm, well-circumscribed T1 hyperintense ovoid lesion at the posterior left aspect of the sella, most 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    MRI 12/28/15: "Stable 6 mm hyperintense cystic lesion in the posterior sella. 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 There is no expansion of the sella. Pituitary neoplasm is considered unlikely. Calcification or ossification along the posterior meningeal falx. This has not significantly changed. There is no enhancement. This most likely represents benign ossification rather than a meningioma. Recommend 2 year follow-up to assure stability of this cyst given the patient's age. If the cyst remains stable following puberty, further follow-up may not be necessary.   Assessment and Plan:  Assessment  ASSESSMENT:  1. History of pituitary cyst/abnormal enhancement/brain cyst:   A. This area was smaller on her MRI in 2015 than it was in 2006 and 2008. Dr. [name], a very experienced neuroradiologist, felt that this area did not represent a pituitary tumor per se. She did not have any excessive pituitary hormone secretions. It was highly unlikely that this area was causing her fatigue or oligomenorrhea. However, Dr. [name] also mentioned an area of calcification of the mid-to-posterior falx and recommended follow up.  B. In 2017 her MRI was essentially unchanged. Dr. Jobe Igo was quite comfortable that the 6.5 mm lesion was a Rathke's cleft  cyst. He was also comfortable with the ossifications being benign.   C. As can be seen in her labs from June and August 2016, this lesion was non-functional, that is, it was not producing any excess amounts of  the six anterior pituitary hormones. This lesion was also definitely posterior to the adenohypophysis, not part of the pituitary gland as her grand uncle's tumor was said to be.  D. Her MRI performed in January 2020 was again c/w being a small, Rathke's cleft cyst.   2. Overweight: This problem is worse.  3. Goiter: Her thyroid gland had shrunk back to normal size at her last visit, but has increased in size at this visit. The process of waxing and waning of thyroid gland size is c/w evolving Hashimoto's thyroiditis. She was euthyroid in May 2017 and again in May 2018. She was mid-euthyroid in August 2019. 4. Oligomenorrhea/PCOS/elevated testosterone, hirsutism:   A. The oligomenorrhea resolved with weight loss, but may recur with weight gain.   B. The oligomenorrhea was likely due to her obesity and PCOS/Stein-Leventhal Syndrome (SLS). Her LH/FSH ratio was elevated in June 2016, c/w PCOS/SLS. Her testosterone/estradiol ratio was similarly elevated, also c/w PCOS/SLS. Her prolactin was normal.    C. Her facial hair and abdominal hair have markedly decreased since starting OCPs. Mom has the same problem, as does mom's mother. Lynnley's hirsutism is not apparent today.  5. Fatigue, other:   A. This problem has resolved.   B. Her TFTs, CBC, iron level, ACTH, and cortisol were all normal in June 2016. We did not find a hormonal cause of her fatigue.  6. Dyspepsia: She has much less dyspepsia since starting metformin and omeprazole.  7. Pallor: Resolved. Her CBC and iron concentration were normal in May 2017.  She is not pallid today. 8. Abnormal MCH/abnormal red blood cell: Her MCH was low in August and in December 2019, but all of her other RBC indices were normal. Her iron was  At the lower end of the reference range. I has asked that she take good MVI with iron daily, but she has not done so very often. She may be developing iron deficiency anemia again.   PLAN:  1. Diagnostic: HbA1c and CBG today.  TFTs, CBC, and iron today. Also reviewed past MRI results.   2. Therapeutic: Continue metformin, 500 mg, twice daily. Continue omeprazole, 20 mg, twice daily. Continue to Eat Right. Try to exercise for one hour per day.  Take either Centrum for Women or One-A-Day for Women 3. Patient education: I reviewed her medical problems, to include her obesity, PCOS, elevated testosterone, female hirsutism, and the apparent Rathke's cleft cyst. I discussed the fact that obesity is a major risk factor for developing T2DM, hyperlipidemias, cardiovascular disease, and cancer. We also discussed her goiter and Hashimoto's disease. We discussed the effects of OCPs on her facial hair issue. Jacara again asked appropriate questions and seemed very pleased with discussion and plan.  4. Follow-up: 5 months    Level of Service: This visit lasted in excess of 50 minutes. More than 50% of the visit was devoted to counseling.  Tillman Sers, MD, CDE Pediatric and Adult Endocrinology

## 2019-03-22 LAB — CBC WITH DIFFERENTIAL/PLATELET
Absolute Monocytes: 577 cells/uL (ref 200–950)
Basophils Absolute: 37 cells/uL (ref 0–200)
Basophils Relative: 0.5 %
Eosinophils Absolute: 81 cells/uL (ref 15–500)
Eosinophils Relative: 1.1 %
HCT: 39.8 % (ref 35.0–45.0)
Hemoglobin: 12.9 g/dL (ref 11.7–15.5)
Lymphs Abs: 1769 cells/uL (ref 850–3900)
MCH: 26.7 pg — ABNORMAL LOW (ref 27.0–33.0)
MCHC: 32.4 g/dL (ref 32.0–36.0)
MCV: 82.4 fL (ref 80.0–100.0)
MPV: 11.9 fL (ref 7.5–12.5)
Monocytes Relative: 7.8 %
Neutro Abs: 4936 cells/uL (ref 1500–7800)
Neutrophils Relative %: 66.7 %
Platelets: 232 10*3/uL (ref 140–400)
RBC: 4.83 10*6/uL (ref 3.80–5.10)
RDW: 14.1 % (ref 11.0–15.0)
Total Lymphocyte: 23.9 %
WBC: 7.4 10*3/uL (ref 3.8–10.8)

## 2019-03-22 LAB — IRON: Iron: 69 ug/dL (ref 40–190)

## 2019-03-22 LAB — TSH: TSH: 2.31 mIU/L

## 2019-03-22 LAB — T3, FREE: T3, Free: 3.2 pg/mL (ref 3.0–4.7)

## 2019-03-22 LAB — T4, FREE: Free T4: 1.1 ng/dL (ref 0.8–1.4)

## 2019-04-10 ENCOUNTER — Encounter (INDEPENDENT_AMBULATORY_CARE_PROVIDER_SITE_OTHER): Payer: Self-pay | Admitting: *Deleted

## 2019-04-28 ENCOUNTER — Other Ambulatory Visit (INDEPENDENT_AMBULATORY_CARE_PROVIDER_SITE_OTHER): Payer: Self-pay | Admitting: "Endocrinology

## 2019-04-28 DIAGNOSIS — R7303 Prediabetes: Secondary | ICD-10-CM

## 2019-04-30 ENCOUNTER — Ambulatory Visit (INDEPENDENT_AMBULATORY_CARE_PROVIDER_SITE_OTHER): Payer: PRIVATE HEALTH INSURANCE | Admitting: Family Medicine

## 2019-04-30 ENCOUNTER — Other Ambulatory Visit: Payer: Self-pay

## 2019-04-30 ENCOUNTER — Encounter: Payer: Self-pay | Admitting: Family Medicine

## 2019-04-30 VITALS — BP 108/60 | HR 71 | Temp 97.0°F | Resp 15 | Ht 67.0 in | Wt 194.4 lb

## 2019-04-30 DIAGNOSIS — Z0000 Encounter for general adult medical examination without abnormal findings: Secondary | ICD-10-CM

## 2019-04-30 DIAGNOSIS — E669 Obesity, unspecified: Secondary | ICD-10-CM | POA: Diagnosis not present

## 2019-04-30 DIAGNOSIS — Z6830 Body mass index (BMI) 30.0-30.9, adult: Secondary | ICD-10-CM

## 2019-04-30 DIAGNOSIS — Z23 Encounter for immunization: Secondary | ICD-10-CM | POA: Diagnosis not present

## 2019-04-30 NOTE — Assessment & Plan Note (Signed)
Pt's PE WNL w/ exception of obesity.  UTD on GYN, Endo, Tdap.  Flu shot given today.  Anticipatory guidance provided.

## 2019-04-30 NOTE — Assessment & Plan Note (Signed)
Ongoing issue for pt.  Stressed need for healthy diet and regular exercise.  Will continue to follow. 

## 2019-04-30 NOTE — Patient Instructions (Signed)
Follow up in 1 year or as needed No need for labs at last time Try and schedule exercise the way you schedule a class- you'll be more likely to stick with it Call with any questions or concerns Stay Safe!!

## 2019-04-30 NOTE — Progress Notes (Signed)
   Subjective:    Patient ID: Anna Parker, female    DOB: 1996/06/22, 23 y.o.   MRN: [id number]  HPI CPE- UTD on GYN, UTD on endo.  Due for flu.  Attending [employer] ([employer])- school this year is online.  Studying Biology on Pre-Nursing track.  No concerns today.  Wants to get an exercise routine established.  No tobacco or vaping.  No drug use.  Occasional alcohol.  Dating- feels safe in relationship.  No concerns for STDs.     Review of Systems Patient reports no vision/ hearing changes, adenopathy,fever, weight change,  persistant/recurrent hoarseness , swallowing issues, chest pain, palpitations, edema, persistant/recurrent cough, hemoptysis, dyspnea (rest/exertional/paroxysmal nocturnal), gastrointestinal bleeding (melena, rectal bleeding), abdominal pain, significant heartburn, bowel changes, GU symptoms (dysuria, hematuria, incontinence), Gyn symptoms (abnormal  bleeding, pain),  syncope, focal weakness, memory loss, numbness & tingling, skin/hair/nail changes, abnormal bruising or bleeding, anxiety, or depression.     Objective:   Physical Exam General Appearance:    Alert, cooperative, no distress, appears stated age  Head:    Normocephalic, without obvious abnormality, atraumatic  Eyes:    PERRL, conjunctiva/corneas clear, EOM's intact, fundi    benign, both eyes  Ears:    Normal TM's and external ear canals, both ears  Nose:   Nares normal, septum midline, mucosa normal, no drainage    or sinus tenderness  Throat:   Lips, mucosa, and tongue normal; teeth and gums normal  Neck:   Supple, symmetrical, trachea midline, no adenopathy;    Thyroid: no enlargement/tenderness/nodules  Back:     Symmetric, no curvature, ROM normal, no CVA tenderness  Lungs:     Clear to auscultation bilaterally, respirations unlabored  Chest Wall:    No tenderness or deformity   Heart:    Regular rate and rhythm, S1 and S2 normal, no murmur, rub   or gallop  Breast Exam:    Deferred to GYN   Abdomen:     Soft, non-tender, bowel sounds active all four quadrants,    no masses, no organomegaly  Genitalia:    Deferred to GYN  Rectal:    Extremities:   Extremities normal, atraumatic, no cyanosis or edema  Pulses:   2+ and symmetric all extremities  Skin:   Skin color, texture, turgor normal, no rashes or lesions  Lymph nodes:   Cervical, supraclavicular, and axillary nodes normal  Neurologic:   CNII-XII intact, normal strength, sensation and reflexes    throughout          Assessment & Plan:

## 2019-05-30 ENCOUNTER — Other Ambulatory Visit (INDEPENDENT_AMBULATORY_CARE_PROVIDER_SITE_OTHER): Payer: Self-pay | Admitting: "Endocrinology

## 2019-08-20 ENCOUNTER — Encounter (INDEPENDENT_AMBULATORY_CARE_PROVIDER_SITE_OTHER): Payer: Self-pay | Admitting: "Endocrinology

## 2019-08-20 ENCOUNTER — Other Ambulatory Visit: Payer: Self-pay

## 2019-08-20 ENCOUNTER — Ambulatory Visit (INDEPENDENT_AMBULATORY_CARE_PROVIDER_SITE_OTHER): Payer: PRIVATE HEALTH INSURANCE | Admitting: "Endocrinology

## 2019-08-20 VITALS — BP 102/64 | HR 100 | Ht 66.73 in | Wt 194.5 lb

## 2019-08-20 DIAGNOSIS — E6609 Other obesity due to excess calories: Secondary | ICD-10-CM | POA: Diagnosis not present

## 2019-08-20 DIAGNOSIS — L680 Hirsutism: Secondary | ICD-10-CM

## 2019-08-20 DIAGNOSIS — E049 Nontoxic goiter, unspecified: Secondary | ICD-10-CM | POA: Diagnosis not present

## 2019-08-20 DIAGNOSIS — D500 Iron deficiency anemia secondary to blood loss (chronic): Secondary | ICD-10-CM | POA: Diagnosis not present

## 2019-08-20 DIAGNOSIS — R1013 Epigastric pain: Secondary | ICD-10-CM

## 2019-08-20 DIAGNOSIS — E663 Overweight: Secondary | ICD-10-CM

## 2019-08-20 DIAGNOSIS — E063 Autoimmune thyroiditis: Secondary | ICD-10-CM

## 2019-08-20 LAB — POCT GLUCOSE (DEVICE FOR HOME USE): Glucose Fasting, POC: 100 mg/dL — AB (ref 70–99)

## 2019-08-20 LAB — POCT GLYCOSYLATED HEMOGLOBIN (HGB A1C): HbA1c POC (<> result, manual entry): 5.1 % (ref 4.0–5.6)

## 2019-08-20 NOTE — Patient Instructions (Signed)
Follow up visit in 6 months. 

## 2019-08-20 NOTE — Progress Notes (Signed)
Subjective:  Subjective  Patient Name: Anna Parker Date of Birth: 1996/03/07  MRN: [id number]  Anna Parker  presents to the office today for follow-up evaluation and management of her history of goiter, oligomenorrhea, elevated testosterone, PCOS, hirsutism, fatigue, dyspepsia, and pituitary cyst/area of differential enhancement.  HISTORY OF PRESENT ILLNESS:   Anna Parker is a 23 y.o. Caucasian young lady.   Anna Parker was accompanied by her sister.  37. Anna Parker is a 72 y.o. young lady who was followed from an early age for precocious  pubertal development.   A. The child started [facility] agonist therapy as a very young child. I took care of her from 2006 to early 2012. At about that time she stopped her [facility] agonist therapy.    B. As part of her evaluation for precocity, Anna Parker had an MRI scan of her brain, performed with and without contrast in 2006, which revealed an "oval area of differential enhancement measuring 5.8 x 2.9 x 3.2 mm which was c/w a pituitary cyst, Rathke's cleft cyst, or small pituitary microadenoma".  There was also a "tiny area of increased signal on precontrast T1-weighted images along the mid to lower infundibulum".   C. A follow up brain MRI scan was performed with and without contrast in 2008. The radiologist stated that, "The previously identified cystic structure has involuted and the gland is now normal. There was a tiny questioned focus of T1 shortening on precontrast images near the infundibulum. I believe this are too has normalized. It may have represented a partial volume averaging of the clivus."   2. On 01/17/14 Anna Parker returned to our clinic, after a 3-year hiatus, for re-evaluation of her pituitary lesion and was seen by Dr. Baldo Ash. [name] had been discovered to have a benign tumor of the pituitary that had damaged his vision. Dr. Everitt Amber, our pediatric ophthalmologist, felt that she deserved an updated pituitary evaluation given the family history and  her personal history of pituitary findings. Mom also stated that they were told at their last endocrine visit that she would need repeat imaging in 5 years.   A. She had achieved a normal adult height and was at a healthy weight for her height. She reported normal menses every 28-36 days. She had not had significant changes in her vision, severe headaches, or galactorrhea.   B. A follow up MRI, with and without contrast, was performed on 01/27/14. Dr. [name] saw a 5 mm area of differential enhancement posterior to the insertion of the infundibulum. This area was smaller than in 2006 and was less intense than it was on the pre-contrast images from the MRI in 2008. 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 The appearance is not typical for pituitary microadenoma given the location posterior to the adenohypophysis.".   C. Subsequent follow up MRI on 12/28/15 showed that she had a 6.5 mm lesion posterior to the adenohypophysis. 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   3. Anna Parker's last [facility] visit occurred on 03/21/19. In the interim she has been healthy. At that visit I continued her metformin 500 mg twice daily and her omeprazole, 20 mg, twice daily. After reviewing her lab results, I suggested that she take a good MVI with iron, such a Centrum for Women or One-A-Day for Women. .  A. In thi interim she has been healthy. Her energy level is usually "pretty good".   B. She has been eating more salads, veggies, and  citrus fruits and taking in less sodas and carbs.   C. She has been exercising more, doing pilates, and walking.   D. She is taking metformin, 500 mg, twice daily and omeprazole, 20 mg, twice daily. She is also taking the One-A-Day for women. She sometimes still misses doses of her meds.   E. She has had headaches about twice a month. She thinks some of her headaches have occurred during the pre-menstrual period.     4. Pertinent Review of  Systems:  Constitutional: The patient feels "pretty good". She is doing "really good" emotionally.  Eyes: Her eye glasses and contacts are working well. There are no other recognized eye problems.  Neck: The patient has no complaints of anterior neck swelling, soreness, tenderness, pressure, discomfort, or difficulty swallowing.   Heart: Heart rate increases with exercise or other physical activity. The patient has no complaints of palpitations, irregular heart beats, chest pain, or chest pressure.   Gastrointestinal: Orie is not having much belly hunger. She has been having some diarrhea at times. The patient has no complaints of acid reflux, upset stomach, stomach aches or pains, or constipation.  Legs: Muscle mass and strength seem normal. There are no complaints of numbness, tingling, burning, or pain. No edema is noted.  Feet: There are no complaints of numbness, tingling, burning, or pain. No edema is noted. Neurologic: There are no recognized problems with muscle movement and strength, sensation, or coordination. GYN: Her LMP was this week. Menses occur regularly now and are not unusually heavy.  She is still on OCPs.  Skin: Since starting OCPs she does not have as much facial hair, so she does not often have to shave the corners of her mouth. The abdominal hair is much less.   PAST MEDICAL, FAMILY, AND SOCIAL HISTORY  Past Medical History:  Diagnosis Date  . Abnormal uterine bleeding   . Amenorrhea   . Central precocious puberty ([place])   . Migraine headache without aura   . PCOS (polycystic ovarian syndrome)   . Pituitary adenoma ([place])     Family History  Problem Relation Age of Onset  . Asthma Mother   . ADD / ADHD Mother   . Arthritis Mother   . Depression Father   . Hyperlipidemia Father   . Sleep apnea Father   . Arthritis Father   . Hypertension Maternal Grandmother   . Cancer Maternal Grandmother        Endometrial ca  . Thyroid disease Maternal Grandmother         hypothyroid  . Kidney failure Maternal Grandmother   . Coronary artery disease Maternal Grandmother   . Heart failure Maternal Grandmother   . Atrial fibrillation Maternal Grandmother   . Hypertension Maternal Grandfather   . Cancer Maternal Grandfather        Stage IV pancreatic CA  . Hyperlipidemia Paternal Grandmother   . Hypothyroidism Paternal Grandmother   . Depression Paternal Grandmother   . Macular degeneration Paternal Grandmother   . Cataracts Paternal Grandmother   . Arthritis Paternal Grandfather   . Depression Paternal Grandfather   . Cancer Paternal Grandfather        skin cancer  . Seizures Sister 2       1 seizure--unknown cause--none since  . Polycystic ovary syndrome Sister    S  Current Outpatient Medications:  .  azelastine (ASTELIN) 0.1 % nasal spray, Place into both nostrils 2 (two) times daily. Use in each nostril as directed, Disp: , Rfl:  .  drospirenone-ethinyl estradiol (YASMIN) 3-0.03 MG tablet, Take 1 tablet by mouth daily., Disp: 3 Package, Rfl: 3 .  fluticasone (FLONASE) 50 MCG/ACT nasal spray, Place 1 spray into both nostrils daily., Disp: , Rfl:  .  metFORMIN (GLUCOPHAGE) 500 MG tablet, TAKE 1 TABLET BY MOUTH TWICE A DAY WITH A MEAL, Disp: 60 tablet, Rfl: 5 .  omeprazole (PRILOSEC) 20 MG capsule, TAKE 1 CAPSULE (20 MG TOTAL) BY MOUTH 2 (TWO) TIMES DAILY BEFORE A MEAL., Disp: 180 capsule, Rfl: 1 .  SUMAtriptan (IMITREX) 100 MG tablet, Take by mouth., Disp: , Rfl:   Allergies as of 08/20/2019 - Review Complete 08/20/2019  Allergen Reaction Noted  . Amoxicillin-pot clavulanate  02/15/2011  . Lupron depot [leuprolide] Hives 10/28/2016  . Nembutal [pentobarbital sodium]  02/15/2011     reports that she has never smoked. She has never used smokeless tobacco. She reports current alcohol use. She reports previous drug use. Drug: Marijuana. Pediatric History  Patient Parents  . Bible,[name] (Mother)  . Gangl,Charles (Father)   Other Topics  Concern  . Not on file  Social History Narrative   Junior at [employer].   Lives with parents and younger sister, 2 frogs 2 cats   School: She is in her junior year at [employer] in [place], [place], but is taking all of her classes on-line from her home. She takes nursing courses now.   Physical activities: She has been more physically active.  Primary Care Provider: Midge Minium, MD  REVIEW OF SYSTEMS: There are no other significant problems involving Lillias's other body systems.    Objective:  Objective  Vital Signs:  BP 102/64   Pulse 100   Ht 5' 6.73" (1.695 m)   Wt 194 lb 8 oz (88.2 kg)   BMI 30.71 kg/m  Growth percentile SmartLinks can only be used for patients less than 54 years old.   Ht Readings from Last 3 Encounters:  08/20/19 5' 6.73" (1.695 m)  04/30/19 5\' 7"  (1.702 m)  03/21/19 5' 6.61" (1.692 m)   Wt Readings from Last 3 Encounters:  08/20/19 194 lb 8 oz (88.2 kg)  04/30/19 194 lb 6 oz (88.2 kg)  03/21/19 189 lb (85.7 kg)   HC Readings from Last 3 Encounters:  No data found for [facility]   Body surface area is 2.04 meters squared. Facility age limit for growth percentiles is 20 years. Facility age limit for growth percentiles is 20 years.    PHYSICAL EXAM:  Constitutional: The patient appears healthy, but a bit heavier. Her height has plateaued. She has gained 5 pounds since her last visit. She is 64 pounds above her Ideal Body Weight of 130 pounds.  She is bright, alert, and looks good today. She is very mature.   Head: The head is normocephalic. Face: The face appears normal. There are no obvious dysmorphic features. She still has grade 2 sideburns.  Eyes: The eyes appear to be normally formed and spaced. Gaze is conjugate. There is no obvious arcus or proptosis. Moisture appears normal. Ears: The ears are normally placed and appear externally normal. Mouth: The oropharynx and tongue appear normal. Dentition appears to be normal for  age. Oral moisture is normal. There is no oral  hyperpigmentation.  Neck: The neck appears to be visibly normal. The thyroid gland is again mildly increased in size at about 21 grams. Today both lobes are symmetrically enlarged. The consistency of the lobes is normal.  The thyroid gland is  not tender to palpation. Lungs: The lungs are clear to auscultation. Air movement is good. Heart: Heart rate and rhythm are regular. Heart sounds S1 and S2 are normal. I did not appreciate any pathologic cardiac murmurs. Abdomen: The abdomen is enlarged. Bowel sounds are normal. There is no obvious hepatomegaly, splenomegaly, or other mass effect.  Arms: Muscle size and bulk are normal for age. Hands: There is no obvious tremor. Phalangeal and metacarpophalangeal joints are normal. Palmar muscles are normal for age. Palmar skin is normal. Palmar moisture is also normal. There is no palmar hyperpigmentation. She has no pallor of her fingernails today. Legs: Muscles appear normal for age.  Neurologic: Strength is normal for age in both the upper and lower extremities. Muscle tone is normal. Sensation to touch is normal in both legs.    LAB DATA:   Results for orders placed or performed in visit on 08/20/19 (from the past 672 hour(s))  POCT Glucose (Device for Home Use)   Collection Time: 08/20/19  9:01 AM  Result Value Ref Range   Glucose Fasting, POC 100 (A) 70 - 99 mg/dL   POC Glucose    POCT HgB A1C   Collection Time: 08/20/19  9:23 AM  Result Value Ref Range   Hemoglobin A1C     HbA1c POC (<> result, manual entry) 5.1 4.0 - 5.6 %   HbA1c, POC (prediabetic range)     HbA1c, POC (controlled diabetic range)    T3, free   Collection Time: 08/20/19  9:41 AM  Result Value Ref Range   T3, Free 3.3 3.0 - 4.7 pg/mL  T4, free   Collection Time: 08/20/19  9:41 AM  Result Value Ref Range   Free T4 1.1 0.8 - 1.4 ng/dL  TSH   Collection Time: 08/20/19  9:41 AM  Result Value Ref Range   TSH 1.58 mIU/L  CBC  with Differential   Collection Time: 08/20/19  9:41 AM  Result Value Ref Range   WBC 7.0 3.8 - 10.8 Thousand/uL   RBC 4.71 3.80 - 5.10 Million/uL   Hemoglobin 12.5 11.7 - 15.5 g/dL   HCT 37.8 35.0 - 45.0 %   MCV 80.3 80.0 - 100.0 fL   MCH 26.5 (L) 27.0 - 33.0 pg   MCHC 33.1 32.0 - 36.0 g/dL   RDW 14.2 11.0 - 15.0 %   Platelets 235 140 - 400 Thousand/uL   MPV 12.0 7.5 - 12.5 fL   Neutro Abs 4,186 1,500 - 7,800 cells/uL   Lymphs Abs 2,121 850 - 3,900 cells/uL   Absolute Monocytes 532 200 - 950 cells/uL   Eosinophils Absolute 112 15 - 500 cells/uL   Basophils Absolute 49 0 - 200 cells/uL   Neutrophils Relative % 59.8 %   Total Lymphocyte 30.3 %   Monocytes Relative 7.6 %   Eosinophils Relative 1.6 %   Basophils Relative 0.7 %  Iron   Collection Time: 08/20/19  9:41 AM  Result Value Ref Range   Iron 36 (L) 40 - 190 mcg/dL  Thyroid peroxidase antibody   Collection Time: 08/20/19  9:41 AM  Result Value Ref Range   Thyroperoxidase Ab SerPl-aCnc 1 <9 IU/mL  Thyroglobulin antibody   Collection Time: 08/20/19  9:41 AM  Result Value Ref Range   Thyroglobulin Ab <1 < or = 1 IU/mL    Labs 12/21: HbA1c 5.1%, CBG 100; TSH 1.58, free T4 1.1, free T3 3.3, anti-thyroid antibodies negative; CBC normal, except MCH 26.5 (27-33), iron 36 (ref 40-190)  Labs 03/21/19: HbA1c 5.2%, CBG 117; TSH 2.31, free T4 1.1, free T3 3.2; CBC normal, except MCH 26.7 (ref 27-33); iron 69;   Labs 08/17/18: HbA1c 5.0%, CBG 101; CBC normal except MCH 26,7 (ref 27-33); iron 34 (ref 27-164)  MCH 26,.5  Labs 03/718: HbA1c 5.1%, CBG 107; TSH 1.49, free T4 1.2, free T3 3.3; CMP normal; CBC normal, except MCH 26.9 (ref 27-33)  Labs 01/28/17: HbA1c 4.9%, CBG 88  Labs 01/12/17: TSH 1.49, free T4 1.1, free T3 3.0  Labs 10/20/16: HbA1c 5.1%, CBG 88  Labs 07/19/16: HbA1c 5.2%  Labs 01/14/16: HbA1c 5.0%  Labs 01/03/16: TSH 1.58, free T4 1.1, free T3 2.9; CBC normal; iron 56  Labs 10/14/15: HbA1c 4.8%  Labs 07/14/15:  HbA1c 5.2%  Labs 04/17/15: CMP normal; IGF-1 279, IGFBP-3 5.1  Labs 02/18/15: TSH 1.859, free T4 0.90, free T3 3.5; LH 16, FSH 6.4, testosterone 62, estradiol 45.9; ACTH 18 (normal 9-57), cortisol 12.4 (normal 4.2-22.4); prolactin 7.0; CBC normal; iron 118 (normal 42-145)  IMAGING:   MRI 09/09/18: There is a 6 mm, well-circumscribed T1 hyperintense ovoid lesion at the posterior left aspect of the sella, most 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    MRI 12/28/15: "Stable 6 mm hyperintense cystic lesion in the posterior sella. 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 There is no expansion of the sella. Pituitary neoplasm is considered unlikely. Calcification or ossification along the posterior meningeal falx. This has not significantly changed. There is no enhancement. This most likely represents benign ossification rather than a meningioma. Recommend 2 year follow-up to assure stability of this cyst given the patient's age. If the cyst remains stable following puberty, further follow-up may not be necessary.   Assessment and Plan:  Assessment  ASSESSMENT:  1. History of pituitary cyst/abnormal enhancement/brain cyst:   A. This area was smaller on her MRI in 2015 than it was in 2006 and 2008. Dr. [name], a very experienced neuroradiologist, felt that this area did not represent a pituitary tumor per se. She did not have any excessive pituitary hormone secretions. It was highly unlikely that this area was causing her fatigue or oligomenorrhea. However, Dr. [name] also mentioned an area of calcification of the mid-to-posterior falx and recommended follow up.  B. In 2017 her MRI was essentially unchanged. Dr. Jobe Igo was quite comfortable that the 6.5 mm lesion was a Rathke's cleft cyst. He was also comfortable with the ossifications being benign.   C. As can be seen in her labs from June and August 2016, this lesion was non-functional,  that is, it was not producing any excess amounts of the six anterior pituitary hormones. This lesion was also definitely posterior to the adenohypophysis, not part of the pituitary gland as her grand uncle's tumor was said to be.  D. Her MRI performed in January 2020 was again c/w being a small, Rathke's cleft cyst.   2. Overweight: This problem is worse. She is still taking in more calories than she burns off.  3. Goiter/thyroiditis:   A. Her thyroid gland had shrunk back to normal size at her prior visit, but had increased in size at her last visit and remained enlarged at this visit. At this visit, however, the lobes have shifted in size once again. The process of waxing and waning of thyroid gland size is c/w evolving Hashimoto's thyroiditis.   B. She was euthyroid in May 2017 and again in May 2018.  She was mid-euthyroid in August 2019. Her TFTs in July 2020 had decreased to about the 25% of the physiologic range. Her TFTS in December were again mid-euthyroid.  4. Oligomenorrhea/PCOS/elevated testosterone, hirsutism:   A. The oligomenorrhea resolved with weight loss, but may recur with weight gain.   B. The oligomenorrhea was likely due to her obesity and PCOS/Stein-Leventhal Syndrome (SLS). Her LH/FSH ratio was elevated in June 2016, c/w PCOS/SLS. Her testosterone/estradiol ratio was similarly elevated, also c/w PCOS/SLS. Her prolactin was normal.    C. Her facial hair and abdominal hair have markedly decreased since starting OCPs. Mom has the same problem, as does mom's mother. Tulip's hirsutism is not apparent today.  5. Fatigue, other:   A. This problem has resolved.   B. Her TFTs, CBC, iron level, ACTH, and cortisol were all normal in June 2016. We did not find a hormonal cause of her fatigue.  6. Dyspepsia: She has much less dyspepsia since starting metformin and omeprazole.  7. Pallor: Resolved. Her CBC and iron concentration were normal in May 2017.  She is not pallid today. 8. Abnormal  MCH/abnormal red blood cell:   A. Her MCH was low in August and in December 2019, but all of her other RBC indices were normal. Her [facility] was also mildly low in July and December 2020.   B. Her iron ws low-normal in July 2020 and low in December 2020. She needs to take her MVI with iron every day.   PLAN:  1. Diagnostic: HbA1c and CBG, TFTs, testosterone, CBC, and iron today.  2. Therapeutic: Continue metformin, 500 mg, twice daily. Continue omeprazole, 20 mg, twice daily. Continue to Eat Right. Try to exercise for one hour per day.  Take either Centrum for Women or One-A-Day for Women 3. Patient education: I reviewed her medical problems, to include her obesity, PCOS, elevated testosterone, female hirsutism, and the apparent Rathke's cleft cyst. I discussed the fact that obesity is a major risk factor for developing T2DM, hyperlipidemias, cardiovascular disease, and cancer. We also discussed her goiter and Hashimoto's disease. We discussed the effects of OCPs on her facial hair issue. Emelyn again asked appropriate questions and seemed very pleased with discussion and plan.  4. Follow-up: 6 months    Level of Service: This visit lasted in excess of 55 minutes. More than 50% of the visit was devoted to counseling.  Tillman Sers, MD, CDE Pediatric and Adult Endocrinology

## 2019-08-21 ENCOUNTER — Ambulatory Visit (INDEPENDENT_AMBULATORY_CARE_PROVIDER_SITE_OTHER): Payer: PRIVATE HEALTH INSURANCE | Admitting: "Endocrinology

## 2019-08-23 LAB — CBC WITH DIFFERENTIAL/PLATELET
Absolute Monocytes: 532 cells/uL (ref 200–950)
Basophils Absolute: 49 cells/uL (ref 0–200)
Basophils Relative: 0.7 %
Eosinophils Absolute: 112 cells/uL (ref 15–500)
Eosinophils Relative: 1.6 %
HCT: 37.8 % (ref 35.0–45.0)
Hemoglobin: 12.5 g/dL (ref 11.7–15.5)
Lymphs Abs: 2121 cells/uL (ref 850–3900)
MCH: 26.5 pg — ABNORMAL LOW (ref 27.0–33.0)
MCHC: 33.1 g/dL (ref 32.0–36.0)
MCV: 80.3 fL (ref 80.0–100.0)
MPV: 12 fL (ref 7.5–12.5)
Monocytes Relative: 7.6 %
Neutro Abs: 4186 cells/uL (ref 1500–7800)
Neutrophils Relative %: 59.8 %
Platelets: 235 10*3/uL (ref 140–400)
RBC: 4.71 10*6/uL (ref 3.80–5.10)
RDW: 14.2 % (ref 11.0–15.0)
Total Lymphocyte: 30.3 %
WBC: 7 10*3/uL (ref 3.8–10.8)

## 2019-08-23 LAB — TESTOS,TOTAL,FREE AND SHBG (FEMALE)
Free Testosterone: 5.9 pg/mL (ref 0.1–6.4)
Sex Hormone Binding: 103 nmol/L (ref 17–124)
Testosterone, Total, LC-MS-MS: 57 ng/dL — ABNORMAL HIGH (ref 2–45)

## 2019-08-23 LAB — T3, FREE: T3, Free: 3.3 pg/mL (ref 3.0–4.7)

## 2019-08-23 LAB — IRON: Iron: 36 ug/dL — ABNORMAL LOW (ref 40–190)

## 2019-08-23 LAB — THYROGLOBULIN ANTIBODY: Thyroglobulin Ab: <1 IU/mL (ref <=1)

## 2019-08-23 LAB — THYROID PEROXIDASE ANTIBODY: Thyroperoxidase Ab SerPl-aCnc: 1 IU/mL (ref <9)

## 2019-08-23 LAB — TSH: TSH: 1.58 mIU/L

## 2019-08-23 LAB — T4, FREE: Free T4: 1.1 ng/dL (ref 0.8–1.4)

## 2019-09-03 ENCOUNTER — Encounter (INDEPENDENT_AMBULATORY_CARE_PROVIDER_SITE_OTHER): Payer: Self-pay | Admitting: *Deleted

## 2019-09-03 ENCOUNTER — Other Ambulatory Visit (INDEPENDENT_AMBULATORY_CARE_PROVIDER_SITE_OTHER): Payer: Self-pay | Admitting: *Deleted

## 2019-09-03 DIAGNOSIS — E611 Iron deficiency: Secondary | ICD-10-CM

## 2019-09-03 MED ORDER — IRON 325 (65 FE) MG PO TABS: ORAL_TABLET | ORAL | 1 refills | Status: DC

## 2019-09-05 ENCOUNTER — Telehealth (INDEPENDENT_AMBULATORY_CARE_PROVIDER_SITE_OTHER): Payer: Self-pay | Admitting: "Endocrinology

## 2019-09-05 NOTE — Telephone Encounter (Signed)
Spoke to patient, she wanted to let Dr. Tobe Sos know she had been taking the wrong vitamin. I advised her a script for Iron had been sent to the pharmacy.

## 2019-09-05 NOTE — Telephone Encounter (Signed)
  Who's calling (name and relationship to patient) : (Self) Anna Parker   Best contact number: [phone]  Provider they see: Dr. Tobe Sos   Reason for call: patient stated that she was prescribed a womens one a day and she thought it would have IRON in the medicine but it doesn't and would like to speak with soomeone. Please advise.      PRESCRIPTION REFILL ONLY  Name of prescription:   Pharmacy:[facility] [address] [place], [place]

## 2019-11-12 ENCOUNTER — Telehealth: Payer: Self-pay

## 2019-11-12 NOTE — Telephone Encounter (Signed)
Patient called about a small abscess form, stating it is slightly painful. Patient would like to make an appointment this week.

## 2019-11-12 NOTE — Telephone Encounter (Signed)
Spoke with patient. Patient reports closed "abcess" on labia/vula area that appeared 4 days ago. Initially was "nickel" size, has reduced in size. Denies any drainage or redness. Tender to touch. Taking hot baths daily, this has helped. Denies fever/chills. Covid 19 prescreen negative. OV scheduled for 3/16 at 4:30pm with Dr. Quincy Simmonds. Advised patient to continue warm baths. Will return call if any additional recommendations. Patient agreeable.   Last AEX 02/12/19  Routing to provider for final review. Patient is agreeable to disposition. Will close encounter.

## 2019-11-13 ENCOUNTER — Encounter: Payer: Self-pay | Admitting: Obstetrics and Gynecology

## 2019-11-13 ENCOUNTER — Other Ambulatory Visit (HOSPITAL_COMMUNITY)
Admission: RE | Admit: 2019-11-13 | Discharge: 2019-11-13 | Disposition: A | Discharge status: Home / Self Care | Payer: PRIVATE HEALTH INSURANCE | Source: Ambulatory Visit | Attending: Obstetrics and Gynecology | Admitting: Obstetrics and Gynecology

## 2019-11-13 ENCOUNTER — Ambulatory Visit (INDEPENDENT_AMBULATORY_CARE_PROVIDER_SITE_OTHER): Payer: PRIVATE HEALTH INSURANCE | Admitting: Obstetrics and Gynecology

## 2019-11-13 ENCOUNTER — Other Ambulatory Visit: Payer: Self-pay

## 2019-11-13 VITALS — BP 110/66 | HR 66 | Temp 97.0°F | Ht 66.0 in | Wt 197.6 lb

## 2019-11-13 DIAGNOSIS — N760 Acute vaginitis: Secondary | ICD-10-CM

## 2019-11-13 DIAGNOSIS — Z113 Encounter for screening for infections with a predominantly sexual mode of transmission: Secondary | ICD-10-CM

## 2019-11-13 DIAGNOSIS — N764 Abscess of vulva: Secondary | ICD-10-CM

## 2019-11-13 MED ORDER — SULFAMETHOXAZOLE-TRIMETHOPRIM 800-160 MG PO TABS: 1.0000 | ORAL_TABLET | Freq: Two times a day (BID) | ORAL | 0 refills | Status: DC

## 2019-11-13 NOTE — Patient Instructions (Signed)
Skin Abscess  A skin abscess is an infected area on or under your skin that contains a collection of pus and other material. An abscess may also be called a furuncle, carbuncle, or boil. An abscess can occur in or on almost any part of your body. Some abscesses break open (rupture) on their own. Most continue to get worse unless they are treated. The infection can spread deeper into the body and eventually into your blood, which can make you feel ill. Treatment usually involves draining the abscess. What are the causes? An abscess occurs when germs, like bacteria, pass through your skin and cause an infection. This may be caused by:  A scrape or cut on your skin.  A puncture wound through your skin, including a needle injection or insect bite.  Blocked oil or sweat glands.  Blocked and infected hair follicles.  A cyst that forms beneath your skin (sebaceous cyst) and becomes infected. What increases the risk? This condition is more likely to develop in people who:  Have a weak body defense system (immune system).  Have diabetes.  Have dry and irritated skin.  Get frequent injections or use illegal IV drugs.  Have a foreign body in a wound, such as a splinter.  Have problems with their lymph system or veins. What are the signs or symptoms? Symptoms of this condition include:  A painful, firm bump under the skin.  A bump with pus at the top. This may break through the skin and drain. Other symptoms include:  Redness surrounding the abscess site.  Warmth.  Swelling of the lymph nodes (glands) near the abscess.  Tenderness.  A sore on the skin. How is this diagnosed? This condition may be diagnosed based on:  A physical exam.  Your medical history.  A sample of pus. This may be used to find out what is causing the infection.  Blood tests.  Imaging tests, such as an ultrasound, CT scan, or MRI. How is this treated? A small abscess that drains on its own may  not need treatment. Treatment for larger abscesses may include:  Moist heat or heat pack applied to the area several times a day.  A procedure to drain the abscess (incision and drainage).  Antibiotic medicines. For a severe abscess, you may first get antibiotics through an IV and then change to antibiotics by mouth. Follow these instructions at home: Medicines   Take over-the-counter and prescription medicines only as told by your health care provider.  If you were prescribed an antibiotic medicine, take it as told by your health care provider. Do not stop taking the antibiotic even if you start to feel better. Abscess care   If you have an abscess that has not drained, apply heat to the affected area. Use the heat source that your health care provider recommends, such as a moist heat pack or a heating pad. ? Place a towel between your skin and the heat source. ? Leave the heat on for 20-30 minutes. ? Remove the heat if your skin turns bright red. This is especially important if you are unable to feel pain, heat, or cold. You may have a greater risk of getting burned.  Follow instructions from your health care provider about how to take care of your abscess. Make sure you: ? Cover the abscess with a bandage (dressing). ? Change your dressing or gauze as told by your health care provider. ? Wash your hands with soap and water before you change the   dressing or gauze. If soap and water are not available, use hand sanitizer.  Check your abscess every day for signs of a worsening infection. Check for: ? More redness, swelling, or pain. ? More fluid or blood. ? Warmth. ? More pus or a bad smell. General instructions  To avoid spreading the infection: ? Do not share personal care items, towels, or hot tubs with others. ? Avoid making skin contact with other people.  Keep all follow-up visits as told by your health care provider. This is important. Contact a health care provider if  you have:  More redness, swelling, or pain around your abscess.  More fluid or blood coming from your abscess.  Warm skin around your abscess.  More pus or a bad smell coming from your abscess.  A fever.  Muscle aches.  Chills or a general ill feeling. Get help right away if you:  Have severe pain.  See red streaks on your skin spreading away from the abscess. Summary  A skin abscess is an infected area on or under your skin that contains a collection of pus and other material.  A small abscess that drains on its own may not need treatment.  Treatment for larger abscesses may include having a procedure to drain the abscess and taking an antibiotic. This information is not intended to replace advice given to you by your health care provider. Make sure you discuss any questions you have with your health care provider. Document Revised: 12/07/2018 Document Reviewed: 09/29/2017 Elsevier Patient Education  2020 Elsevier Inc.  

## 2019-11-13 NOTE — Progress Notes (Signed)
GYNECOLOGY  VISIT   HPI: 24 y.o.   Single  Caucasian  female   G0P0000 with Patient's last menstrual period was 10/18/2019 (approximate).   here for possible abscess in left groin/left vulvar area. Patient states this did drain slight amount of blood yesterday.    States she had a painful abscess, which started on 4 - 5 days ago. It drained blood yesterday.  It is much small and not as hard now.   No fever, shakes, or chills.   She is questioning if she has a yeast infection. She has yellow, green discharge.  No itching or odor.   Sexually active with boyfriend.  This is a new partner.   She had an abscess on her mons years ago. It drained on its own.   Normal A1C per patient.  Level was 5.1 on 08/20/19.   She is now on iron. Fe 36 on 08/20/19.   Doing on line learning.   GYNECOLOGIC HISTORY: Patient's last menstrual period was 10/18/2019 (approximate). Contraception: Yasmin Menopausal hormone therapy:  n/a Last mammogram:  n/a Last pap smear: never        OB History    Gravida  0   Para  0   Term  0   Preterm  0   AB  0   Living  0     SAB  0   TAB  0   Ectopic  0   Multiple  0   Live Births  0              Patient Active Problem List   Diagnosis Date Noted  . Physical exam 04/30/2019  . Migraine 11/15/2018  . Elevated testosterone level in female 01/30/2017  . Female hirsutism 01/30/2017  . PCOS (polycystic ovarian syndrome) 01/30/2017  . Mass of posterior pituitary ([place]) 01/14/2016  . Dyspepsia 03/26/2015  . Obesity 02/17/2015  . Oligomenorrhea 02/17/2015  . Other fatigue 02/17/2015  . Goiter 02/17/2015  . Cavus deformity of foot 10/30/2013  . Abnormality of gait 10/30/2013    Past Medical History:  Diagnosis Date  . Abnormal uterine bleeding   . Amenorrhea   . Central precocious puberty ([place])   . Migraine headache without aura   . PCOS (polycystic ovarian syndrome)   . Pituitary adenoma [facility])     Past Surgical History:   Procedure Laterality Date  . left ankle fracture Left 2003  . left ankle repair Left 2016  . TONSILLECTOMY AND ADENOIDECTOMY    . TYMPANOPLASTY      Current Outpatient Medications  Medication Sig Dispense Refill  . azelastine (ASTELIN) 0.1 % nasal spray Place into both nostrils 2 (two) times daily. Use in each nostril as directed    . drospirenone-ethinyl estradiol (YASMIN) 3-0.03 MG tablet Take 1 tablet by mouth daily. 3 Package 3  . Ferrous Sulfate (IRON) 325 (65 Fe) MG TABS Take 1 tablet daily. 90 tablet 1  . fluticasone (FLONASE) 50 MCG/ACT nasal spray Place 1 spray into both nostrils daily.    . metFORMIN (GLUCOPHAGE) 500 MG tablet TAKE 1 TABLET BY MOUTH TWICE A DAY WITH A MEAL 60 tablet 5  . omeprazole (PRILOSEC) 20 MG capsule TAKE 1 CAPSULE (20 MG TOTAL) BY MOUTH 2 (TWO) TIMES DAILY BEFORE A MEAL. 180 capsule 1  . SUMAtriptan (IMITREX) 100 MG tablet Take by mouth.     No current facility-administered medications for this visit.     ALLERGIES: Amoxicillin-pot clavulanate, Lupron depot [leuprolide], and Nembutal [pentobarbital sodium]  Family History  Problem Relation Age of Onset  . Asthma Mother   . ADD / ADHD Mother   . Arthritis Mother   . Depression Father   . Hyperlipidemia Father   . Sleep apnea Father   . Arthritis Father   . Hypertension Maternal Grandmother   . Cancer Maternal Grandmother        Endometrial ca  . Thyroid disease Maternal Grandmother        hypothyroid  . Kidney failure Maternal Grandmother   . Coronary artery disease Maternal Grandmother   . Heart failure Maternal Grandmother   . Atrial fibrillation Maternal Grandmother   . Hypertension Maternal Grandfather   . Cancer Maternal Grandfather        Stage IV pancreatic CA  . Hyperlipidemia Paternal Grandmother   . Hypothyroidism Paternal Grandmother   . Depression Paternal Grandmother   . Macular degeneration Paternal Grandmother   . Cataracts Paternal Grandmother   . Arthritis Paternal  Grandfather   . Depression Paternal Grandfather   . Cancer Paternal Grandfather        skin cancer  . Seizures Sister 2       1 seizure--unknown cause--none since  . Polycystic ovary syndrome Sister     Social History   Socioeconomic History  . Marital status: Single    Spouse name: Not on file  . Number of children: Not on file  . Years of education: Not on file  . Highest education level: Not on file  Occupational History  . Not on file  Tobacco Use  . Smoking status: Never Smoker  . Smokeless tobacco: Never Used  Substance and Sexual Activity  . Alcohol use: Yes    Comment: occasionally  . Drug use: Not Currently    Types: Marijuana  . Sexual activity: Not Currently    Birth control/protection: Abstinence  Other Topics Concern  . Not on file  Social History Narrative   Junior at [employer].   Lives with parents and younger sister, 2 frogs 2 cats   Social Determinants of Health   Financial Resource Strain:   . Difficulty of Paying Living Expenses:   Food Insecurity:   . Worried About Charity fundraiser in the Last Year:   . Arboriculturist in the Last Year:   Transportation Needs:   . Film/video editor (Medical):   Marland Kitchen Lack of Transportation (Non-Medical):   Physical Activity:   . Days of Exercise per Week:   . Minutes of Exercise per Session:   Stress:   . Feeling of Stress :   Social Connections:   . Frequency of Communication with Friends and Family:   . Frequency of Social Gatherings with Friends and Family:   . Attends Religious Services:   . Active Member of Clubs or Organizations:   . Attends Archivist Meetings:   Marland Kitchen Marital Status:   Intimate Partner Violence:   . Fear of Current or Ex-Partner:   . Emotionally Abused:   Marland Kitchen Physically Abused:   . Sexually Abused:     Review of Systems  Skin:       Left groin/left vulvar lesion/abscess  All other systems reviewed and are negative.   PHYSICAL EXAMINATION:    BP  110/66 (Cuff Size: Large)   Pulse 66   Temp (!) 97 F (36.1 C) (Temporal)   Ht 5\' 6"  (1.676 m)   Wt 197 lb 9.6 oz (89.6 kg)   LMP 10/18/2019 (  Approximate)   BMI 31.89 kg/m     General appearance: alert, cooperative and appears stated age    Pelvic: External genitalia:  Left labia 1.5 cm indurated area, nontender.  Excoriated skin.               Urethra:  normal appearing urethra with no masses, tenderness or lesions              Bartholins and Skenes: normal                 Vagina: normal appearing vagina with normal color and discharge, no lesions              Cervix: no lesions                Bimanual Exam:  Uterus:  normal size, contour, position, consistency, mobility, non-tender              Adnexa: no mass, fullness, tenderness             Chaperone was present for exam.  ASSESSMENT  Left vulvar abscess, resolving.  STD screening.  Vaginitis On Metformin with normal A1C.   PLAN  Vaginitis and cervicitis testing.  She declines I and D of the abscess. Will do Bactrim DS po bid x 7 days.  Warm compresses. Recheck in one week.  Call if symptoms do not improve further in 48 hours.   An After Visit Summary was printed and given to the patient.  __20____ minutes face to face time of which over 50% was spent in counseling.

## 2019-11-15 LAB — CERVICOVAGINAL ANCILLARY ONLY
Bacterial Vaginitis (gardnerella): NEGATIVE
Candida Glabrata: NEGATIVE
Candida Vaginitis: NEGATIVE
Chlamydia: NEGATIVE
Comment: NEGATIVE
Comment: NEGATIVE
Comment: NEGATIVE
Comment: NEGATIVE
Comment: NEGATIVE
Comment: NORMAL
Neisseria Gonorrhea: NEGATIVE
Trichomonas: NEGATIVE

## 2019-11-20 NOTE — Progress Notes (Signed)
GYNECOLOGY  VISIT   HPI: 24 y.o.   Single  Caucasian  female   G0P0000 with Patient's last menstrual period was 11/14/2019 (exact date).   here for 1 week follow up on left vulvar abscess.   See on 11/13/19 and tx with Bactrim DS po bid x 7 days.  She had negative cervicitis and vaginitis testing on the same day.   States pain and swelling of the vulva went away.   She does shave her vulva.   GYNECOLOGIC HISTORY: Patient's last menstrual period was 11/14/2019 (exact date). Contraception: Yasmin Menopausal hormone therapy:  n/a Last mammogram:  n/a Last pap smear: never        OB History    Gravida  0   Para  0   Term  0   Preterm  0   AB  0   Living  0     SAB  0   TAB  0   Ectopic  0   Multiple  0   Live Births  0              Patient Active Problem List   Diagnosis Date Noted  . Physical exam 04/30/2019  . Migraine 11/15/2018  . Elevated testosterone level in female 01/30/2017  . Female hirsutism 01/30/2017  . PCOS (polycystic ovarian syndrome) 01/30/2017  . Mass of posterior pituitary ([place]) 01/14/2016  . Dyspepsia 03/26/2015  . Obesity 02/17/2015  . Oligomenorrhea 02/17/2015  . Other fatigue 02/17/2015  . Goiter 02/17/2015  . Cavus deformity of foot 10/30/2013  . Abnormality of gait 10/30/2013    Past Medical History:  Diagnosis Date  . Abnormal uterine bleeding   . Amenorrhea   . Central precocious puberty ([place])   . Migraine headache without aura   . PCOS (polycystic ovarian syndrome)   . Pituitary adenoma [facility])     Past Surgical History:  Procedure Laterality Date  . left ankle fracture Left 2003  . left ankle repair Left 2016  . TONSILLECTOMY AND ADENOIDECTOMY    . TYMPANOPLASTY      Current Outpatient Medications  Medication Sig Dispense Refill  . azelastine (ASTELIN) 0.1 % nasal spray Place into both nostrils 2 (two) times daily. Use in each nostril as directed    . drospirenone-ethinyl estradiol (YASMIN) 3-0.03 MG tablet  Take 1 tablet by mouth daily. 3 Package 3  . Ferrous Sulfate (IRON) 325 (65 Fe) MG TABS Take 1 tablet daily. 90 tablet 1  . fluticasone (FLONASE) 50 MCG/ACT nasal spray Place 1 spray into both nostrils daily.    . metFORMIN (GLUCOPHAGE) 500 MG tablet TAKE 1 TABLET BY MOUTH TWICE A DAY WITH A MEAL 60 tablet 5  . omeprazole (PRILOSEC) 20 MG capsule TAKE 1 CAPSULE (20 MG TOTAL) BY MOUTH 2 (TWO) TIMES DAILY BEFORE A MEAL. 180 capsule 1  . SUMAtriptan (IMITREX) 100 MG tablet Take by mouth.     No current facility-administered medications for this visit.     ALLERGIES: Amoxicillin-pot clavulanate, Lupron depot [leuprolide], and Nembutal [pentobarbital sodium]  Family History  Problem Relation Age of Onset  . Asthma Mother   . ADD / ADHD Mother   . Arthritis Mother   . Depression Father   . Hyperlipidemia Father   . Sleep apnea Father   . Arthritis Father   . Hypertension Maternal Grandmother   . Cancer Maternal Grandmother        Endometrial ca  . Thyroid disease Maternal Grandmother  hypothyroid  . Kidney failure Maternal Grandmother   . Coronary artery disease Maternal Grandmother   . Heart failure Maternal Grandmother   . Atrial fibrillation Maternal Grandmother   . Hypertension Maternal Grandfather   . Cancer Maternal Grandfather        Stage IV pancreatic CA  . Hyperlipidemia Paternal Grandmother   . Hypothyroidism Paternal Grandmother   . Depression Paternal Grandmother   . Macular degeneration Paternal Grandmother   . Cataracts Paternal Grandmother   . Arthritis Paternal Grandfather   . Depression Paternal Grandfather   . Cancer Paternal Grandfather        skin cancer  . Seizures Sister 2       1 seizure--unknown cause--none since  . Polycystic ovary syndrome Sister     Social History   Socioeconomic History  . Marital status: Single    Spouse name: Not on file  . Number of children: Not on file  . Years of education: Not on file  . Highest education  level: Not on file  Occupational History  . Not on file  Tobacco Use  . Smoking status: Never Smoker  . Smokeless tobacco: Never Used  Substance and Sexual Activity  . Alcohol use: Yes    Comment: occasionally  . Drug use: Not Currently    Types: Marijuana  . Sexual activity: Not Currently    Birth control/protection: Abstinence  Other Topics Concern  . Not on file  Social History Narrative   Junior at [employer].   Lives with parents and younger sister, 2 frogs 2 cats   Social Determinants of Health   Financial Resource Strain:   . Difficulty of Paying Living Expenses:   Food Insecurity:   . Worried About Charity fundraiser in the Last Year:   . Arboriculturist in the Last Year:   Transportation Needs:   . Film/video editor (Medical):   Marland Kitchen Lack of Transportation (Non-Medical):   Physical Activity:   . Days of Exercise per Week:   . Minutes of Exercise per Session:   Stress:   . Feeling of Stress :   Social Connections:   . Frequency of Communication with Friends and Family:   . Frequency of Social Gatherings with Friends and Family:   . Attends Religious Services:   . Active Member of Clubs or Organizations:   . Attends Archivist Meetings:   Marland Kitchen Marital Status:   Intimate Partner Violence:   . Fear of Current or Ex-Partner:   . Emotionally Abused:   Marland Kitchen Physically Abused:   . Sexually Abused:     Review of Systems  All other systems reviewed and are negative.   PHYSICAL EXAMINATION:    BP 110/68 (Cuff Size: Large)   Pulse 70   Temp 98.2 F (36.8 C) (Temporal)   Ht 5\' 6"  (1.676 m)   Wt 196 lb 12.8 oz (89.3 kg)   LMP 11/14/2019 (Exact Date)   BMI 31.76 kg/m     General appearance: alert, cooperative and appears stated age  Pelvic: External genitalia:  Sebaceous cyst 7 mm of left inferior labia majora.  Sebum draining.               Urethra:  normal appearing urethra with no masses, tenderness or lesions              Bartholins and  Skenes: normal  Vagina: normal appearing vagina with normal color and discharge, no lesions              Cervix: no lesions                Bimanual Exam:  Uterus:  normal size, contour, position, consistency, mobility, non-tender              Adnexa: no mass, fullness, tenderness           Chaperone was present for exam.  ASSESSMENT  Vulvar sebaceous cyst.  Abscess resolved.   PLAN  Discussion about sebaceous cysts and vulvar abscesses.  FU prn.    An After Visit Summary was printed and given to the patient.  __15___ minutes face to face time of which over 50% was spent in counseling.

## 2019-11-21 ENCOUNTER — Other Ambulatory Visit: Payer: Self-pay

## 2019-11-22 ENCOUNTER — Ambulatory Visit (INDEPENDENT_AMBULATORY_CARE_PROVIDER_SITE_OTHER): Payer: PRIVATE HEALTH INSURANCE | Admitting: Obstetrics and Gynecology

## 2019-11-22 ENCOUNTER — Encounter: Payer: Self-pay | Admitting: Obstetrics and Gynecology

## 2019-11-22 VITALS — BP 110/68 | HR 70 | Temp 98.2°F | Ht 66.0 in | Wt 196.8 lb

## 2019-11-22 DIAGNOSIS — L723 Sebaceous cyst: Secondary | ICD-10-CM

## 2019-11-22 NOTE — Patient Instructions (Signed)
Epidermal Cyst  An epidermal cyst is a sac made of skin tissue. The sac contains a substance called keratin. Keratin is a protein that is normally secreted through the hair follicles. When keratin becomes trapped in the top layer of skin (epidermis), it can form an epidermal cyst. Epidermal cysts can be found anywhere on your body. These cysts are usually harmless (benign), and they may not cause symptoms unless they become infected. What are the causes? This condition may be caused by:  A blocked hair follicle.  A hair that curls and re-enters the skin instead of growing straight out of the skin (ingrown hair).  A blocked pore.  Irritated skin.  An injury to the skin.  Certain conditions that are passed along from parent to child (inherited).  Human papillomavirus (HPV).  Long-term (chronic) sun damage to the skin. What increases the risk? The following factors may make you more likely to develop an epidermal cyst:  Having acne.  Being overweight.  Being 30-40 years old. What are the signs or symptoms? The only symptom of this condition may be a small, painless lump underneath the skin. When an epidermal cyst ruptures, it may become infected. Symptoms may include:  Redness.  Inflammation.  Tenderness.  Warmth.  Fever.  Keratin draining from the cyst. Keratin is grayish-white, bad-smelling substance.  Pus draining from the cyst. How is this diagnosed? This condition is diagnosed with a physical exam.  In some cases, you may have a sample of tissue (biopsy) taken from your cyst to be examined under a microscope or tested for bacteria.  You may be referred to a health care provider who specializes in skin care (dermatologist). How is this treated? In many cases, epidermal cysts go away on their own without treatment. If a cyst becomes infected, treatment may include:  Opening and draining the cyst, done by a health care provider. After draining, minor surgery to  remove the rest of the cyst may be done.  Antibiotic medicine.  Injections of medicines (steroids) that help to reduce inflammation.  Surgery to remove the cyst. Surgery may be done if the cyst: ? Becomes large. ? Bothers you. ? Has a chance of turning into cancer.  Do not try to open a cyst yourself. Follow these instructions at home:  Take over-the-counter and prescription medicines only as told by your health care provider.  If you were prescribed an antibiotic medicine, take it it as told by your health care provider. Do not stop using the antibiotic even if you start to feel better.  Keep the area around your cyst clean and dry.  Wear loose, dry clothing.  Avoid touching your cyst.  Check your cyst every day for signs of infection. Check for: ? Redness, swelling, or pain. ? Fluid or blood. ? Warmth. ? Pus or a bad smell.  Keep all follow-up visits as told by your health care provider. This is important. How is this prevented?  Wear clean, dry, clothing.  Avoid wearing tight clothing.  Keep your skin clean and dry. Take showers or baths every day. Contact a health care provider if:  Your cyst develops symptoms of infection.  Your condition is not improving or is getting worse.  You develop a cyst that looks different from other cysts you have had.  You have a fever. Get help right away if:  Redness spreads from the cyst into the surrounding area. Summary  An epidermal cyst is a sac made of skin tissue. These cysts are   usually harmless (benign), and they may not cause symptoms unless they become infected.  If a cyst becomes infected, treatment may include surgery to open and drain the cyst, or to remove it. Treatment may also include medicines by mouth or through an injection.  Take over-the-counter and prescription medicines only as told by your health care provider. If you were prescribed an antibiotic medicine, take it as told by your health care  provider. Do not stop using the antibiotic even if you start to feel better.  Contact a health care provider if your condition is not improving or is getting worse.  Keep all follow-up visits as told by your health care provider. This is important. This information is not intended to replace advice given to you by your health care provider. Make sure you discuss any questions you have with your health care provider. Document Revised: 12/07/2018 Document Reviewed: 02/27/2018 Elsevier Patient Education  2020 Elsevier Inc.  

## 2020-01-10 ENCOUNTER — Telehealth (INDEPENDENT_AMBULATORY_CARE_PROVIDER_SITE_OTHER): Payer: PRIVATE HEALTH INSURANCE | Admitting: Family Medicine

## 2020-01-10 ENCOUNTER — Encounter: Payer: Self-pay | Admitting: Family Medicine

## 2020-01-10 ENCOUNTER — Other Ambulatory Visit: Payer: Self-pay

## 2020-01-10 DIAGNOSIS — J0110 Acute frontal sinusitis, unspecified: Secondary | ICD-10-CM | POA: Diagnosis not present

## 2020-01-10 MED ORDER — PREDNISONE 10 MG PO TABS: ORAL_TABLET | ORAL | 0 refills | Status: DC

## 2020-01-10 NOTE — Progress Notes (Signed)
I have discussed the procedure for the virtual visit with the patient who has given consent to proceed with assessment and treatment.   Pt unable to obtain vitals.   Anna Parker, CMA     

## 2020-01-10 NOTE — Progress Notes (Signed)
   Virtual Visit via Video   I connected with patient on 01/10/20 at 11:00 AM EDT by a video enabled telemedicine application and verified that I am speaking with the correct person using two identifiers.  Location patient: Home Location provider: [facility], Office Persons participating in the virtual visit: Patient, Provider, [place] (Jess B)  I discussed the limitations of evaluation and management by telemedicine and the availability of in person appointments. The patient expressed understanding and agreed to proceed.  Subjective:   HPI:   URI- 'so much sinus congestion and drainage over the past 3 days'.  Tried OTC cough and cold syrup.  Has had some nose bleeds.  No fevers.  No loss of taste/smell.  No body aches or chills.  + frontal sinus pressure and HA (resolved w/ ibuprofen)  Nasal drainage is 'mostly clear'.  Taking AllerTec and using Flonase.  Minimal cough.    ROS:   See pertinent positives and negatives per HPI.  Patient Active Problem List   Diagnosis Date Noted  . Physical exam 04/30/2019  . Migraine 11/15/2018  . Elevated testosterone level in female 01/30/2017  . Female hirsutism 01/30/2017  . PCOS (polycystic ovarian syndrome) 01/30/2017  . Mass of posterior pituitary ([place]) 01/14/2016  . Dyspepsia 03/26/2015  . Obesity 02/17/2015  . Oligomenorrhea 02/17/2015  . Other fatigue 02/17/2015  . Goiter 02/17/2015  . Cavus deformity of foot 10/30/2013  . Abnormality of gait 10/30/2013    Social History   Tobacco Use  . Smoking status: Never Smoker  . Smokeless tobacco: Never Used  Substance Use Topics  . Alcohol use: Yes    Comment: occasionally    Current Outpatient Medications:  .  drospirenone-ethinyl estradiol (YASMIN) 3-0.03 MG tablet, Take 1 tablet by mouth daily., Disp: 3 Package, Rfl: 3 .  Ferrous Sulfate (IRON) 325 (65 Fe) MG TABS, Take 1 tablet daily., Disp: 90 tablet, Rfl: 1 .  fluticasone (FLONASE) 50 MCG/ACT nasal spray, Place 1 spray  into both nostrils daily., Disp: , Rfl:  .  metFORMIN (GLUCOPHAGE) 500 MG tablet, TAKE 1 TABLET BY MOUTH TWICE A DAY WITH A MEAL, Disp: 60 tablet, Rfl: 5 .  omeprazole (PRILOSEC) 20 MG capsule, TAKE 1 CAPSULE (20 MG TOTAL) BY MOUTH 2 (TWO) TIMES DAILY BEFORE A MEAL., Disp: 180 capsule, Rfl: 1 .  SUMAtriptan (IMITREX) 100 MG tablet, Take by mouth., Disp: , Rfl:  .  azelastine (ASTELIN) 0.1 % nasal spray, Place into both nostrils 2 (two) times daily. Use in each nostril as directed, Disp: , Rfl:   Allergies  Allergen Reactions  . Amoxicillin-Pot Clavulanate   . Lupron Depot [Leuprolide] Hives  . Nembutal [Pentobarbital Sodium]     Objective:   There were no vitals taken for this visit. AAOx3, NAD NCAT, EOMI No obvious CN deficits Coloring WNL Pt is able to speak clearly, coherently without shortness of breath or increased work of breathing.  Thought process is linear.  Mood is appropriate.   Assessment and Plan:   Frontal sinusitis- new.  Likely allergic or viral as she is well appearing and has no red flags on hx.  No need for abx but will start Prednisone to improve congestion, inflammation, and pressure.  Reviewed supportive care and red flags that should prompt return.  Pt expressed understanding and is in agreement w/ plan.    Annye Asa, MD 01/10/2020

## 2020-01-27 ENCOUNTER — Other Ambulatory Visit: Payer: Self-pay | Admitting: Obstetrics and Gynecology

## 2020-01-29 NOTE — Telephone Encounter (Signed)
Medication refill request: Yasmin Last AEX:  02/12/19 Dr. Quincy Simmonds Next AEX: 02/13/20 Last MMG (if hormonal medication request): n/a Refill authorized: Please advise; order pended #1 packs w/0 refills until AEX if appropriate.

## 2020-02-12 ENCOUNTER — Other Ambulatory Visit: Payer: Self-pay

## 2020-02-12 NOTE — Progress Notes (Signed)
24 y.o. G36P0000 Single Caucasian female here for annual exam.    No more pain from her vulvar cyst.   Wants to continue her birth control pills.   Will see Dr. Tobe Sos next week.  She is taking Metformin bid.   Wants to be a Immunologist.  She is considering her Covid vaccines.  PCP: Annye Asa, MD    Patient's last menstrual period was 02/06/2020 (exact date).     Period Cycle (Days): 28 Period Duration (Days): 4-5 days Period Pattern: Regular Menstrual Flow: Heavy Menstrual Control: Maxi pad Menstrual Control Change Freq (Hours): 3x/day Dysmenorrhea:  (cramping prior to menses)     Sexually active: Yes.    The current method of family planning is OCP (estrogen/progesterone)--Yasmin.   Exercising: Yes.    walking and weights Smoker:  no  Health Maintenance: Pap:  n/a History of abnormal Pap:  n/a MMG:  n/a Colonoscopy:  n/a BMD:   n/a  Result  n/a TDaP: 2018 Gardasil:   Yes, completed HIV: 02-12-19 NR Hep C: 02-12-19 Neg Screening Labs:  Endocrinology   reports that she has never smoked. She has never used smokeless tobacco. She reports current alcohol use of about 2.0 standard drinks of alcohol per week. She reports previous drug use.  Past Medical History:  Diagnosis Date  . Abnormal uterine bleeding   . Amenorrhea   . Central precocious puberty ([place])   . Migraine headache without aura   . PCOS (polycystic ovarian syndrome)   . Pituitary adenoma [facility])     Past Surgical History:  Procedure Laterality Date  . left ankle fracture Left 2003  . left ankle repair Left 2016  . TONSILLECTOMY AND ADENOIDECTOMY    . TYMPANOPLASTY      Current Outpatient Medications  Medication Sig Dispense Refill  . azelastine (ASTELIN) 0.1 % nasal spray Place into both nostrils 2 (two) times daily. Use in each nostril as directed    . drospirenone-ethinyl estradiol (YASMIN) 3-0.03 MG tablet TAKE 1 TABLET BY MOUTH EVERY DAY 28 tablet 0  . Ferrous Sulfate (IRON) 325 (65 Fe) MG  TABS Take 1 tablet daily. 90 tablet 1  . fluticasone (FLONASE) 50 MCG/ACT nasal spray Place 1 spray into both nostrils daily.    . metFORMIN (GLUCOPHAGE) 500 MG tablet TAKE 1 TABLET BY MOUTH TWICE A DAY WITH A MEAL 60 tablet 5  . Multiple Vitamin (MULTIVITAMIN) capsule Take 1 capsule by mouth daily.    Marland Kitchen omeprazole (PRILOSEC) 20 MG capsule TAKE 1 CAPSULE (20 MG TOTAL) BY MOUTH 2 (TWO) TIMES DAILY BEFORE A MEAL. 180 capsule 1  . SUMAtriptan (IMITREX) 100 MG tablet Take by mouth.     No current facility-administered medications for this visit.    Family History  Problem Relation Age of Onset  . Asthma Mother   . ADD / ADHD Mother   . Arthritis Mother   . Depression Father   . Hyperlipidemia Father   . Sleep apnea Father   . Arthritis Father   . Hypertension Maternal Grandmother   . Cancer Maternal Grandmother        Endometrial ca  . Thyroid disease Maternal Grandmother        hypothyroid  . Kidney failure Maternal Grandmother   . Coronary artery disease Maternal Grandmother   . Heart failure Maternal Grandmother   . Atrial fibrillation Maternal Grandmother   . Hypertension Maternal Grandfather   . Cancer Maternal Grandfather        Stage IV pancreatic CA  .  Hyperlipidemia Paternal Grandmother   . Hypothyroidism Paternal Grandmother   . Depression Paternal Grandmother   . Macular degeneration Paternal Grandmother   . Cataracts Paternal Grandmother   . Arthritis Paternal Grandfather   . Depression Paternal Grandfather   . Cancer Paternal Grandfather        skin cancer  . Seizures Sister 2       1 seizure--unknown cause--none since  . Polycystic ovary syndrome Sister     Review of Systems  All other systems reviewed and are negative.   Exam:   BP 110/66 (Cuff Size: Large)   Pulse 70   Temp (!) 97.2 F (36.2 C) (Temporal)   Resp (!) 22   Ht 5' 6.25" (1.683 m)   Wt 197 lb 9.6 oz (89.6 kg)   LMP 02/06/2020 (Exact Date)   BMI 31.65 kg/m     General appearance:  alert, cooperative and appears stated age Head: normocephalic, without obvious abnormality, atraumatic Neck: no adenopathy, supple, symmetrical, trachea midline and thyroid normal to inspection and palpation Lungs: clear to auscultation bilaterally Breasts: normal appearance, no masses or tenderness, No nipple retraction or dimpling, No nipple discharge or bleeding, No axillary adenopathy Heart: regular rate and rhythm Abdomen: soft, non-tender; no masses, no organomegaly Extremities: extremities normal, atraumatic, no cyanosis or edema Skin: skin color, texture, turgor normal. No rashes or lesions Lymph nodes: cervical, supraclavicular, and axillary nodes normal. Neurologic: grossly normal  Pelvic: External genitalia:  no lesions              No abnormal inguinal nodes palpated.              Urethra:  normal appearing urethra with no masses, tenderness or lesions              Bartholins and Skenes: normal                 Vagina: normal appearing vagina with normal color and discharge, no lesions              Cervix: no lesions              Pap taken: Yes.   Bimanual Exam:  Uterus:  normal size, contour, position, consistency, mobility, non-tender              Adnexa: no mass, fullness, tenderness            Chaperone was present for exam.  Assessment:   Well woman visit with normal exam. Hx central precocious puberty.  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  PCOS. Migraine without aura.  Plan: Mammogram screening discussed. Self breast awareness reviewed. Pap and HR HPV as above. Guidelines for Calcium, Vitamin D, regular exercise program including cardiovascular and weight bearing exercise. Rx for Yasmin 3 packs and 3 refills.  STD screening.  Follow up annually and prn.   After visit summary provided.

## 2020-02-13 ENCOUNTER — Other Ambulatory Visit (HOSPITAL_COMMUNITY)
Admission: RE | Admit: 2020-02-13 | Discharge: 2020-02-13 | Disposition: A | Discharge status: Home / Self Care | Payer: PRIVATE HEALTH INSURANCE | Source: Ambulatory Visit | Attending: Obstetrics and Gynecology | Admitting: Obstetrics and Gynecology

## 2020-02-13 ENCOUNTER — Encounter: Payer: Self-pay | Admitting: Obstetrics and Gynecology

## 2020-02-13 ENCOUNTER — Ambulatory Visit (INDEPENDENT_AMBULATORY_CARE_PROVIDER_SITE_OTHER): Payer: PRIVATE HEALTH INSURANCE | Admitting: Obstetrics and Gynecology

## 2020-02-13 VITALS — BP 110/66 | HR 70 | Temp 97.2°F | Resp 22 | Ht 66.25 in | Wt 197.6 lb

## 2020-02-13 DIAGNOSIS — Z113 Encounter for screening for infections with a predominantly sexual mode of transmission: Secondary | ICD-10-CM | POA: Diagnosis not present

## 2020-02-13 DIAGNOSIS — Z01419 Encounter for gynecological examination (general) (routine) without abnormal findings: Secondary | ICD-10-CM | POA: Diagnosis not present

## 2020-02-13 MED ORDER — DROSPIRENONE-ETHINYL ESTRADIOL 3-0.03 MG PO TABS: 1.0000 | ORAL_TABLET | Freq: Every day | ORAL | 3 refills | Status: DC

## 2020-02-13 NOTE — Patient Instructions (Signed)

## 2020-02-14 LAB — CYTOLOGY - PAP
Chlamydia: NEGATIVE
Comment: NEGATIVE
Comment: NEGATIVE
Comment: NORMAL
Diagnosis: NEGATIVE
Neisseria Gonorrhea: NEGATIVE
Trichomonas: NEGATIVE

## 2020-02-14 LAB — HEPATITIS C ANTIBODY: Hep C Virus Ab: <0.1 s/co ratio (ref 0.0–0.9)

## 2020-02-14 LAB — HEP, RPR, HIV PANEL
HIV Screen 4th Generation wRfx: NONREACTIVE
Hepatitis B Surface Ag: NEGATIVE
RPR Ser Ql: NONREACTIVE

## 2020-02-18 ENCOUNTER — Other Ambulatory Visit: Payer: Self-pay

## 2020-02-18 ENCOUNTER — Ambulatory Visit (INDEPENDENT_AMBULATORY_CARE_PROVIDER_SITE_OTHER): Payer: PRIVATE HEALTH INSURANCE | Admitting: "Endocrinology

## 2020-02-18 ENCOUNTER — Encounter (INDEPENDENT_AMBULATORY_CARE_PROVIDER_SITE_OTHER): Payer: Self-pay | Admitting: "Endocrinology

## 2020-02-18 VITALS — BP 112/68 | HR 68 | Ht 66.73 in | Wt 195.0 lb

## 2020-02-18 DIAGNOSIS — E669 Obesity, unspecified: Secondary | ICD-10-CM

## 2020-02-18 DIAGNOSIS — R718 Other abnormality of red blood cells: Secondary | ICD-10-CM

## 2020-02-18 DIAGNOSIS — Z6831 Body mass index (BMI) 31.0-31.9, adult: Secondary | ICD-10-CM | POA: Diagnosis not present

## 2020-02-18 DIAGNOSIS — E063 Autoimmune thyroiditis: Secondary | ICD-10-CM

## 2020-02-18 DIAGNOSIS — E282 Polycystic ovarian syndrome: Secondary | ICD-10-CM

## 2020-02-18 DIAGNOSIS — R5383 Other fatigue: Secondary | ICD-10-CM

## 2020-02-18 DIAGNOSIS — E049 Nontoxic goiter, unspecified: Secondary | ICD-10-CM

## 2020-02-18 LAB — POCT GLYCOSYLATED HEMOGLOBIN (HGB A1C): Hemoglobin A1C: 4.9 % (ref 4.0–5.6)

## 2020-02-18 LAB — POCT GLUCOSE (DEVICE FOR HOME USE): Glucose Fasting, POC: 91 mg/dL (ref 70–99)

## 2020-02-18 NOTE — Progress Notes (Signed)
Subjective:  Subjective  Patient Name: Anna Parker Date of Birth: 1996/07/02  MRN: [id number]  Anna Parker  presents to the office today for follow-up evaluation and management of her history of goiter, oligomenorrhea, elevated testosterone, PCOS, hirsutism, fatigue, dyspepsia, and pituitary cyst/area of differential enhancement.  HISTORY OF PRESENT ILLNESS:   Anna Parker is a 24 y.o. Caucasian young lady.   Anna Parker was accompanied by her sister.  31. Anna Parker is a 63 y.o. young lady who was followed from an early age for precocious  pubertal development.   A. The child started [facility] agonist therapy as a very young child. I took care of her from 2006 to early 2012. At about that time she stopped her [facility] agonist therapy.    B. As part of her evaluation for precocity, Anna Parker had an MRI scan of her brain, performed with and without contrast in 2006, which revealed an "oval area of differential enhancement measuring 5.8 x 2.9 x 3.2 mm which was c/w a pituitary cyst, Rathke's cleft cyst, or small pituitary microadenoma".  There was also a "tiny area of increased signal on precontrast T1-weighted images along the mid to lower infundibulum".   C. A follow up brain MRI scan was performed with and without contrast in 2008. The radiologist stated that, "The previously identified cystic structure has involuted and the gland is now normal. There was a tiny questioned focus of T1 shortening on precontrast images near the infundibulum. I believe this are too has normalized. It may have represented a partial volume averaging of the clivus."   2. On 01/17/14 [name] returned to our clinic, after a 3-year hiatus, for re-evaluation of her pituitary lesion and was seen by Dr. Baldo Ash. [name] had been discovered to have a benign tumor of the pituitary that had damaged his vision. Dr. Everitt Amber, our pediatric ophthalmologist, felt that she deserved an updated pituitary evaluation given the family history and  her personal history of pituitary findings. Mom also stated that they were told at their last endocrine visit that she would need repeat imaging in 5 years.   A. She had achieved a normal adult height and was at a healthy weight for her height. She reported normal menses every 28-36 days. She had not had significant changes in her vision, severe headaches, or galactorrhea.   B. A follow up MRI, with and without contrast, was performed on 01/27/14. Dr. [name] saw a 5 mm area of differential enhancement posterior to the insertion of the infundibulum. This area was smaller than in 2006 and was less intense than it was on the pre-contrast images from the MRI in 2008. 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 The appearance is not typical for pituitary microadenoma given the location posterior to the adenohypophysis.".   C. Subsequent follow up MRI on 12/28/15 showed that she had a 6.5 mm lesion posterior to the adenohypophysis. 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   3. Anna Parker's last [facility] visit occurred on 08/20/19. In the interim she has been healthy. At that visit I continued her metformin 500 mg twice daily and her omeprazole, 20 mg, twice daily. I suggested again that she take a good MVI with iron, such a Centrum for Women or One-A-Day for Women. However, after reviewing her lab results I also asked her to take one 325 mg ferrous sulfate tablet per day for 90 days. She is still taking the iron now.  A. In thi interim she has been healthy. Her energy level is "pretty good".   B. She has been trying to Eat Right more often.   C. She has been exercising more, swimming, and walking.   D. She is taking metformin, 500 mg, twice daily and omeprazole, 20 mg, twice daily. She is also taking the One-A-Day for women and one ferrous sulfate tablet daily.  She sometimes still misses doses of her meds.   E. She has had headaches about twice a month, sometimes  related to academic stress, but sometimes occurring during the pre-menstrual period.     4. Pertinent Review of Systems:  Constitutional: The patient feels "a lot better". She is doing "much better" emotionally now that she can be more active socially.   Eyes: Her eye glasses and contacts are working well. There are no other recognized eye problems.  Neck: The patient has no complaints of anterior neck swelling, soreness, tenderness, pressure, discomfort, or difficulty swallowing.   Heart: Heart rate increases with exercise or other physical activity. The patient has no complaints of palpitations, irregular heart beats, chest pain, or chest pressure.   Gastrointestinal: [name] is not having much belly hunger. She also feels full faster. Caffeine causes more frequent BMs. The patient has no complaints of acid reflux, upset stomach, stomach aches or pains, or constipation.  Legs: She still has some "arthritis" in her left knee. Muscle mass and strength seem normal. There are no other complaints of numbness, tingling, burning, or pain. No edema is noted.  Feet: She still has some "arthritis" in her left ankle. There are no other complaints of numbness, tingling, burning, or pain. No edema is noted. Neurologic: There are no recognized problems with muscle movement and strength, sensation, or coordination. GYN: Her LMP was two weeks ago.  Menses occur regularly now and are not unusually heavy.  She is still on OCPs.  Skin: Since starting OCPs she does not have as much facial hair, so she does not often have to shave the corners of her mouth very often. The abdominal hair is still there.    PAST MEDICAL, FAMILY, AND SOCIAL HISTORY  Past Medical History:  Diagnosis Date  . Abnormal uterine bleeding   . Amenorrhea   . Central precocious puberty ([place])   . Migraine headache without aura   . PCOS (polycystic ovarian syndrome)   . Pituitary adenoma ([place])     Family History  Problem Relation Age of  Onset  . Asthma Mother   . ADD / ADHD Mother   . Arthritis Mother   . Depression Father   . Hyperlipidemia Father   . Sleep apnea Father   . Arthritis Father   . Hypertension Maternal Grandmother   . Cancer Maternal Grandmother        Endometrial ca  . Thyroid disease Maternal Grandmother        hypothyroid  . Kidney failure Maternal Grandmother   . Coronary artery disease Maternal Grandmother   . Heart failure Maternal Grandmother   . Atrial fibrillation Maternal Grandmother   . Hypertension Maternal Grandfather   . Cancer Maternal Grandfather        Stage IV pancreatic CA  . Hyperlipidemia Paternal Grandmother   . Hypothyroidism Paternal Grandmother   . Depression Paternal Grandmother   . Macular degeneration Paternal Grandmother   . Cataracts Paternal Grandmother   . Arthritis Paternal Grandfather   . Depression Paternal Grandfather   . Cancer Paternal Grandfather  skin cancer  . Seizures Sister 2       1 seizure--unknown cause--none since  . Polycystic ovary syndrome Sister    S  Current Outpatient Medications:  .  azelastine (ASTELIN) 0.1 % nasal spray, Place into both nostrils 2 (two) times daily. Use in each nostril as directed, Disp: , Rfl:  .  drospirenone-ethinyl estradiol (YASMIN) 3-0.03 MG tablet, Take 1 tablet by mouth daily., Disp: 84 tablet, Rfl: 3 .  Ferrous Sulfate (IRON) 325 (65 Fe) MG TABS, Take 1 tablet daily., Disp: 90 tablet, Rfl: 1 .  fluticasone (FLONASE) 50 MCG/ACT nasal spray, Place 1 spray into both nostrils daily., Disp: , Rfl:  .  metFORMIN (GLUCOPHAGE) 500 MG tablet, TAKE 1 TABLET BY MOUTH TWICE A DAY WITH A MEAL, Disp: 60 tablet, Rfl: 5 .  omeprazole (PRILOSEC) 20 MG capsule, TAKE 1 CAPSULE (20 MG TOTAL) BY MOUTH 2 (TWO) TIMES DAILY BEFORE A MEAL., Disp: 180 capsule, Rfl: 1 .  Multiple Vitamin (MULTIVITAMIN) capsule, Take 1 capsule by mouth daily. (Patient not taking: Reported on 02/18/2020), Disp: , Rfl:  .  SUMAtriptan (IMITREX) 100 MG  tablet, Take by mouth. (Patient not taking: Reported on 02/18/2020), Disp: , Rfl:   Allergies as of 02/18/2020 - Review Complete 02/18/2020  Allergen Reaction Noted  . Amoxicillin-pot clavulanate  02/15/2011  . Lupron depot [leuprolide] Hives 10/28/2016  . Nembutal [pentobarbital sodium]  02/15/2011     reports that she has never smoked. She has never used smokeless tobacco. She reports current alcohol use of about 2.0 standard drinks of alcohol per week. She reports previous drug use. Pediatric History  Patient Parents  . Slates,[name] (Mother)  . Osuch,Charles (Father)   Other Topics Concern  . Not on file  Social History Narrative   Senior at [employer].   Lives with parents and younger sister, 6 cats and 1 dog   School: She finished her junior year at [employer] in [place], [place], but is taking all of her classes on-line from her home. She takes nursing courses now. She will probably graduate this Fall. 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  Physical activities: She has been more physically active.  Primary Care Provider: Midge Minium, MD  REVIEW OF SYSTEMS: There are no other significant problems involving Zehra's other body systems.    Objective:  Objective  Vital Signs:  BP 112/68   Pulse 68   Ht 5' 6.73" (1.695 m)   Wt 195 lb (88.5 kg)   LMP 02/06/2020 (Exact Date)   BMI 30.79 kg/m  Growth percentile SmartLinks can only be used for patients less than 31 years old.   Ht Readings from Last 3 Encounters:  02/18/20 5' 6.73" (1.695 m)  02/13/20 5' 6.25" (1.683 m)  11/22/19 5\' 6"  (1.676 m)   Wt Readings from Last 3 Encounters:  02/18/20 195 lb (88.5 kg)  02/13/20 197 lb 9.6 oz (89.6 kg)  11/22/19 196 lb 12.8 oz (89.3 kg)   HC Readings from Last 3 Encounters:  No data found for [facility]   Body surface area is 2.04 meters squared. Facility age limit for growth  percentiles is 20 years. Facility age limit for growth percentiles is 20 years.    PHYSICAL EXAM:  Constitutional: The patient appears healthy, but a bit heavier. Her height has plateaued. She has gained 1 pound since her last visit. She is  65 pounds above her Ideal Body Weight of 130 pounds.  She is bright, alert, and looks good today. She is very mature.   Head: The head is normocephalic. Face: The face appears normal. There are no obvious dysmorphic features. She still has grade 2 sideburns.  Eyes: The eyes appear to be normally formed and spaced. Gaze is conjugate. There is no obvious arcus or proptosis. Moisture appears normal. Ears: The ears are normally placed and appear externally normal. Mouth: The oropharynx and tongue appear normal. Dentition appears to be normal for age. Oral moisture is normal. There is no oral  hyperpigmentation.  Neck: The neck appears to be visibly normal. The thyroid gland is again mildly increased in size, but smaller today at about 20+ grams. Today both lobes are symmetrically enlarged. The consistency of the lobes is normal.  The thyroid gland is not tender to palpation. Lungs: The lungs are clear to auscultation. Air movement is good. Heart: Heart rate and rhythm are regular. Heart sounds S1 and S2 are normal. I did not appreciate any pathologic cardiac murmurs. Abdomen: The abdomen is enlarged. Bowel sounds are normal. There is no obvious hepatomegaly, splenomegaly, or other mass effect.  Arms: Muscle size and bulk are normal for age. Hands: There is no obvious tremor. Phalangeal and metacarpophalangeal joints are normal. Palmar muscles are normal for age. Palmar skin is normal. Palmar moisture is also normal. There is no palmar hyperpigmentation. She has no pallor of her fingernails today. Legs: Muscles appear normal for age.  Neurologic: Strength is normal for age in both the upper and lower extremities. Muscle tone is normal. Sensation to touch is normal  in both legs.    LAB DATA:   Results for orders placed or performed in visit on 02/18/20 (from the past 672 hour(s))  POCT Glucose (Device for Home Use)   Collection Time: 02/18/20 10:33 AM  Result Value Ref Range   Glucose Fasting, POC 91 70 - 99 mg/dL   POC Glucose    POCT glycosylated hemoglobin (Hb A1C)   Collection Time: 02/18/20 10:33 AM  Result Value Ref Range   Hemoglobin A1C 4.9 4.0 - 5.6 %   HbA1c POC (<> result, manual entry)     HbA1c, POC (prediabetic range)     HbA1c, POC (controlled diabetic range)    Results for orders placed or performed in visit on 02/13/20 (from the past 672 hour(s))  Cytology - PAP( [place])   Collection Time: 02/13/20  3:16 PM  Result Value Ref Range   Neisseria Gonorrhea Negative    Chlamydia Negative    Trichomonas Negative    Adequacy      Satisfactory for evaluation; transformation zone component PRESENT.   Diagnosis      - Negative for intraepithelial lesion or malignancy (NILM)   Comment Normal Reference Range Trichomonas - Negative    Comment Normal Reference Ranger Chlamydia - Negative    Comment      Normal Reference Range Neisseria Gonorrhea - Negative  HEP, RPR, HIV Panel   Collection Time: 02/13/20  3:26 PM  Result Value Ref Range   Hepatitis B Surface Ag Negative Negative   RPR Ser Ql Non Reactive Non Reactive   HIV Screen 4th Generation wRfx Non Reactive Non Reactive  Hepatitis C antibody   Collection Time: 02/13/20  3:26 PM  Result Value Ref Range   Hep C Virus Ab <0.1 0.0 - 0.9 s/co ratio     Labs 02/18/20: HbA1c 4.9%; CBG 91;  CBC and iron pending  Labs 12/21: HbA1c 5.1%, CBG 100; TSH 1.58, free T4 1.1, free T3 3.3, anti-thyroid antibodies negative; CBC normal, except MCH 26.5 (27-33), iron 36 (ref 40-190)   Labs 03/21/19: HbA1c 5.2%, CBG 117; TSH 2.31, free T4 1.1, free T3 3.2; CBC normal, except MCH 26.7 (ref 27-33); iron 69;   Labs 08/17/18: HbA1c 5.0%, CBG 101; CBC normal except MCH 26,7 (ref 27-33); iron 34  (ref 27-164)  MCH 26,.5  Labs 03/718: HbA1c 5.1%, CBG 107; TSH 1.49, free T4 1.2, free T3 3.3; CMP normal; CBC normal, except MCH 26.9 (ref 27-33)  Labs 01/28/17: HbA1c 4.9%, CBG 88  Labs 01/12/17: TSH 1.49, free T4 1.1, free T3 3.0  Labs 10/20/16: HbA1c 5.1%, CBG 88  Labs 07/19/16: HbA1c 5.2%  Labs 01/14/16: HbA1c 5.0%  Labs 01/03/16: TSH 1.58, free T4 1.1, free T3 2.9; CBC normal; iron 56  Labs 10/14/15: HbA1c 4.8%  Labs 07/14/15: HbA1c 5.2%  Labs 04/17/15: CMP normal; IGF-1 279, IGFBP-3 5.1  Labs 02/18/15: TSH 1.859, free T4 0.90, free T3 3.5; LH 16, FSH 6.4, testosterone 62, estradiol 45.9; ACTH 18 (normal 9-57), cortisol 12.4 (normal 4.2-22.4); prolactin 7.0; CBC normal; iron 118 (normal 42-145)  IMAGING:   MRI 09/09/18: There is a 6 mm, well-circumscribed T1 hyperintense ovoid lesion at the posterior left aspect of the sella, most 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    MRI 12/28/15: "Stable 6 mm hyperintense cystic lesion in the posterior sella. 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 There is no expansion of the sella. Pituitary neoplasm is considered unlikely. Calcification or ossification along the posterior meningeal falx. This has not significantly changed. There is no enhancement. This most likely represents benign ossification rather than a meningioma. Recommend 2 year follow-up to assure stability of this cyst given the patient's age. If the cyst remains stable following puberty, further follow-up may not be necessary.   Assessment and Plan:  Assessment  ASSESSMENT:  1. History of pituitary cyst/abnormal enhancement/brain cyst:   A. This area was smaller on her MRI in 2015 than it was in 2006 and 2008. Dr. [name], a very experienced neuroradiologist, felt that this area did not represent a pituitary tumor per se. She did not have any excessive pituitary hormone secretions. It was highly unlikely that this  area was causing her fatigue or oligomenorrhea. However, Dr. [name] also mentioned an area of calcification of the mid-to-posterior falx and recommended follow up.  B. In 2017 her MRI was essentially unchanged. Dr. Jobe Igo was quite comfortable that the 6.5 mm lesion was a Rathke's cleft cyst. He was also comfortable with the ossifications being benign.   C. As can be seen in her labs from June and August 2016, this lesion was non-functional, that is, it was not producing any excess amounts of the six anterior pituitary hormones. This lesion was also definitely posterior to the adenohypophysis, not part of the pituitary gland as her grand uncle's tumor was said to be.  D. Her MRI performed in January 2020 was again c/w being a small, Rathke's cleft cyst.   E. Her headaches are less frequent and are related to stress and the premenstrual periods.   2. Overweight: This problem is a bit worse. She is still taking in more calories than she burns off, but that may be changing.  3. Goiter/thyroiditis:   A. Her thyroid gland had shrunk back to normal size at her  prior visit, had increased in size at her last visit, but is a bit smaller at this visit. The process of waxing and waning of thyroid gland size is c/w evolving Hashimoto's thyroiditis.   B. She was euthyroid in May 2017 and again in May 2018. She was mid-euthyroid in August 2019. Her TFTs in July 2020 had decreased to about the 25% of the physiologic range. Her TFTS in December 2020 were again mid-euthyroid.  4. Oligomenorrhea/PCOS/elevated testosterone, hirsutism:   A. The oligomenorrhea resolved with weight loss, but may recur with weight gain.   B. The oligomenorrhea was likely due to her obesity and PCOS/Stein-Leventhal Syndrome (SLS). Her LH/FSH ratio was elevated in June 2016, c/w PCOS/SLS. Her testosterone/estradiol ratio was similarly elevated, also c/w PCOS/SLS. Her prolactin was normal.    C. Her facial hair and abdominal hair have markedly  decreased since starting OCPs. Mom has the same problem, as does mom's mother. Melissaann's hirsutism is not apparent today.  5. Fatigue, other:   A. This problem has resolved.   B. Her TFTs, CBC, iron level, ACTH, and cortisol were all normal in June 2016. We did not find a hormonal cause of her fatigue.  6. Dyspepsia: She has much less dyspepsia since starting metformin and omeprazole.  7. Pallor: Resolved. Her CBC and iron concentration were normal in May 2017.  She is not pallid today. 8. Abnormal MCH/abnormal red blood cell:   A. Her MCH was low in August and in December 2019, but all of her other RBC indices were normal. Her [facility] was also mildly low in July and December 2020.   B. Her iron was low-normal in July 2020 and low in December 2020. She needed to take her ferrous sulfate. We need to repeat her CBC and iron now.   C. I suspect that Satine is one of those people who do not absorb iron as readily from food. If so, she will need to take a good quality MVI with iron every day.  PLAN:  1. Diagnostic: HbA1c and CBG, CBC, and iron today.  2. Therapeutic: Continue metformin, 500 mg, twice daily. Continue omeprazole, 20 mg, twice daily. Continue to Eat Right. Try to exercise for one hour per day.  Take either Centrum for Women or One-A-Day for Women 3. Patient education: I reviewed her medical problems, to include her obesity, PCOS, elevated testosterone, female hirsutism, and the apparent Rathke's cleft cyst. I discussed the fact that obesity is a major risk factor for developing T2DM, hyperlipidemias, cardiovascular disease, and cancer. We also discussed her goiter and Hashimoto's disease. We discussed the effects of OCPs on her facial hair issue. Kalsey again asked appropriate questions and seemed very pleased with discussion and plan.  4. Follow-up: 6 months    Level of Service: This visit lasted in excess of 55 minutes. More than 50% of the visit was devoted to counseling.  Tillman Sers,  MD, CDE Pediatric and Adult Endocrinology

## 2020-02-18 NOTE — Patient Instructions (Addendum)
Follow up visit in 6 months. 

## 2020-02-19 LAB — CBC WITH DIFFERENTIAL/PLATELET
Absolute Monocytes: 498 cells/uL (ref 200–950)
Basophils Absolute: 50 cells/uL (ref 0–200)
Basophils Relative: 0.6 %
Eosinophils Absolute: 66 cells/uL (ref 15–500)
Eosinophils Relative: 0.8 %
HCT: 42 % (ref 35.0–45.0)
Hemoglobin: 13.6 g/dL (ref 11.7–15.5)
Lymphs Abs: 1876 cells/uL (ref 850–3900)
MCH: 27.8 pg (ref 27.0–33.0)
MCHC: 32.4 g/dL (ref 32.0–36.0)
MCV: 85.7 fL (ref 80.0–100.0)
MPV: 11.4 fL (ref 7.5–12.5)
Monocytes Relative: 6 %
Neutro Abs: 5810 cells/uL (ref 1500–7800)
Neutrophils Relative %: 70 %
Platelets: 238 10*3/uL (ref 140–400)
RBC: 4.9 10*6/uL (ref 3.80–5.10)
RDW: 13.5 % (ref 11.0–15.0)
Total Lymphocyte: 22.6 %
WBC: 8.3 10*3/uL (ref 3.8–10.8)

## 2020-02-19 LAB — IRON: Iron: 108 ug/dL (ref 40–190)

## 2020-02-27 ENCOUNTER — Encounter (INDEPENDENT_AMBULATORY_CARE_PROVIDER_SITE_OTHER): Payer: Self-pay | Admitting: *Deleted

## 2020-03-02 ENCOUNTER — Other Ambulatory Visit (INDEPENDENT_AMBULATORY_CARE_PROVIDER_SITE_OTHER): Payer: Self-pay | Admitting: "Endocrinology

## 2020-03-02 DIAGNOSIS — E611 Iron deficiency: Secondary | ICD-10-CM

## 2020-03-13 ENCOUNTER — Other Ambulatory Visit (INDEPENDENT_AMBULATORY_CARE_PROVIDER_SITE_OTHER): Payer: Self-pay | Admitting: "Endocrinology

## 2020-03-13 DIAGNOSIS — R7303 Prediabetes: Secondary | ICD-10-CM

## 2020-04-16 ENCOUNTER — Other Ambulatory Visit (INDEPENDENT_AMBULATORY_CARE_PROVIDER_SITE_OTHER): Payer: Self-pay | Admitting: "Endocrinology

## 2020-05-02 ENCOUNTER — Other Ambulatory Visit: Payer: Self-pay

## 2020-05-02 ENCOUNTER — Encounter: Payer: Self-pay | Admitting: Family Medicine

## 2020-05-02 ENCOUNTER — Ambulatory Visit (INDEPENDENT_AMBULATORY_CARE_PROVIDER_SITE_OTHER): Payer: PRIVATE HEALTH INSURANCE | Admitting: Family Medicine

## 2020-05-02 VITALS — BP 106/74 | HR 80 | Temp 97.7°F | Resp 16 | Ht 67.0 in | Wt 198.1 lb

## 2020-05-02 DIAGNOSIS — E669 Obesity, unspecified: Secondary | ICD-10-CM

## 2020-05-02 DIAGNOSIS — Z6831 Body mass index (BMI) 31.0-31.9, adult: Secondary | ICD-10-CM

## 2020-05-02 DIAGNOSIS — Z0000 Encounter for general adult medical examination without abnormal findings: Secondary | ICD-10-CM

## 2020-05-02 DIAGNOSIS — Z23 Encounter for immunization: Secondary | ICD-10-CM

## 2020-05-02 LAB — BASIC METABOLIC PANEL
BUN: 10 mg/dL (ref 6–23)
CO2: 27 mEq/L (ref 19–32)
Calcium: 9.6 mg/dL (ref 8.4–10.5)
Chloride: 103 mEq/L (ref 96–112)
Creatinine, Ser: 0.61 mg/dL (ref 0.40–1.20)
GFR: 123.01 mL/min (ref >60.00)
Glucose, Bld: 77 mg/dL (ref 70–99)
Potassium: 4.4 mEq/L (ref 3.5–5.1)
Sodium: 139 mEq/L (ref 135–145)

## 2020-05-02 LAB — CBC WITH DIFFERENTIAL/PLATELET
Basophils Absolute: 0.1 10*3/uL (ref 0.0–0.1)
Basophils Relative: 0.8 % (ref 0.0–3.0)
Eosinophils Absolute: 0.2 10*3/uL (ref 0.0–0.7)
Eosinophils Relative: 2 % (ref 0.0–5.0)
HCT: 42.2 % (ref 36.0–46.0)
Hemoglobin: 14.2 g/dL (ref 12.0–15.0)
Lymphocytes Relative: 32.9 % (ref 12.0–46.0)
Lymphs Abs: 2.6 10*3/uL (ref 0.7–4.0)
MCHC: 33.6 g/dL (ref 30.0–36.0)
MCV: 82.7 fl (ref 78.0–100.0)
Monocytes Absolute: 0.6 10*3/uL (ref 0.1–1.0)
Monocytes Relative: 7.9 % (ref 3.0–12.0)
Neutro Abs: 4.4 10*3/uL (ref 1.4–7.7)
Neutrophils Relative %: 56.4 % (ref 43.0–77.0)
Platelets: 240 10*3/uL (ref 150.0–400.0)
RBC: 5.1 Mil/uL (ref 3.87–5.11)
RDW: 14.4 % (ref 11.5–15.5)
WBC: 7.8 10*3/uL (ref 4.0–10.5)

## 2020-05-02 LAB — HEPATIC FUNCTION PANEL
ALT: 20 U/L (ref 0–35)
AST: 19 U/L (ref 0–37)
Albumin: 4.1 g/dL (ref 3.5–5.2)
Alkaline Phosphatase: 60 U/L (ref 39–117)
Bilirubin, Direct: 0.1 mg/dL (ref 0.0–0.3)
Total Bilirubin: 0.4 mg/dL (ref 0.2–1.2)
Total Protein: 7 g/dL (ref 6.0–8.3)

## 2020-05-02 LAB — LIPID PANEL
Cholesterol: 234 mg/dL — ABNORMAL HIGH (ref 0–200)
HDL: 81.9 mg/dL (ref >39.00)
LDL Cholesterol: 133 mg/dL — ABNORMAL HIGH (ref 0–99)
NonHDL: 151.79
Total CHOL/HDL Ratio: 3
Triglycerides: 94 mg/dL (ref 0.0–149.0)
VLDL: 18.8 mg/dL (ref 0.0–40.0)

## 2020-05-02 LAB — TSH: TSH: 1.93 u[IU]/mL (ref 0.35–4.50)

## 2020-05-02 NOTE — Progress Notes (Signed)
   Subjective:    Patient ID: Anna Parker, female    DOB: 29-Oct-1995, 24 y.o.   MRN: [id number]  HPI CPE- UTD on GYN, Tdap.  Due for COVID and flu.  Finishing EMT training.  Swimming and walking regularly.  Reviewed past medical, surgical, family and social histories.    Review of Systems Patient reports no vision/ hearing changes, adenopathy,fever, weight change,  persistant/recurrent hoarseness , swallowing issues, chest pain, palpitations, edema, persistant/recurrent cough, hemoptysis, dyspnea (rest/exertional/paroxysmal nocturnal), gastrointestinal bleeding (melena, rectal bleeding), abdominal pain, significant heartburn, bowel changes, GU symptoms (dysuria, hematuria, incontinence), Gyn symptoms (abnormal  bleeding, pain),  syncope, focal weakness, memory loss, numbness & tingling, skin/hair/nail changes, abnormal bruising or bleeding, anxiety, or depression.   This visit occurred during the SARS-CoV-2 public health emergency.  Safety protocols were in place, including screening questions prior to the visit, additional usage of staff PPE, and extensive cleaning of exam room while observing appropriate contact time as indicated for disinfecting solutions.       Objective:   Physical Exam General Appearance:    Alert, cooperative, no distress, appears stated age, obese  Head:    Normocephalic, without obvious abnormality, atraumatic  Eyes:    PERRL, conjunctiva/corneas clear, EOM's intact, fundi    benign, both eyes  Ears:    Normal TM's and external ear canals, both ears  Nose:   Deferred due to COVID  Throat:   Neck:   Supple, symmetrical, trachea midline, no adenopathy;    Thyroid: no enlargement/tenderness/nodules  Back:     Symmetric, no curvature, ROM normal, no CVA tenderness  Lungs:     Clear to auscultation bilaterally, respirations unlabored  Chest Wall:    No tenderness or deformity   Heart:    Regular rate and rhythm, S1 and S2 normal, no murmur, rub   or  gallop  Breast Exam:    Deferred to GYN  Abdomen:     Soft, non-tender, bowel sounds active all four quadrants,    no masses, no organomegaly  Genitalia:    Deferred to GYN  Rectal:    Extremities:   Extremities normal, atraumatic, no cyanosis or edema  Pulses:   2+ and symmetric all extremities  Skin:   Skin color, texture, turgor normal, no rashes or lesions  Lymph nodes:   Cervical, supraclavicular, and axillary nodes normal  Neurologic:   CNII-XII intact, normal strength, sensation and reflexes    throughout          Assessment & Plan:

## 2020-05-02 NOTE — Patient Instructions (Signed)
Follow up in 1 year or as needed We'll notify you of your lab results and make any changes if needed Continue to work on healthy diet and regular exercise- you can do it! Call with any questions or concerns Stay Safe!  Stay Healthy! 

## 2020-05-02 NOTE — Assessment & Plan Note (Signed)
Ongoing issue for pt.  Discussed need for healthy diet and regular exercise.  Check labs to risk stratify.  Will follow. 

## 2020-05-02 NOTE — Assessment & Plan Note (Signed)
Pt's PE WNL w/ exception of obesity.  UTD on GYN, Tdap.  Flu given today.  Encouraged pt to get COVID vaccines ASAP.  Check labs.  Anticipatory guidance provided.

## 2020-07-02 ENCOUNTER — Other Ambulatory Visit: Payer: Self-pay

## 2020-07-02 ENCOUNTER — Ambulatory Visit (INDEPENDENT_AMBULATORY_CARE_PROVIDER_SITE_OTHER): Payer: PRIVATE HEALTH INSURANCE | Admitting: Otolaryngology

## 2020-07-02 ENCOUNTER — Other Ambulatory Visit (INDEPENDENT_AMBULATORY_CARE_PROVIDER_SITE_OTHER): Payer: Self-pay

## 2020-07-02 ENCOUNTER — Encounter (INDEPENDENT_AMBULATORY_CARE_PROVIDER_SITE_OTHER): Payer: Self-pay | Admitting: Otolaryngology

## 2020-07-02 VITALS — Temp 96.8°F

## 2020-07-02 DIAGNOSIS — H6122 Impacted cerumen, left ear: Secondary | ICD-10-CM | POA: Diagnosis not present

## 2020-07-02 DIAGNOSIS — G44229 Chronic tension-type headache, not intractable: Secondary | ICD-10-CM | POA: Diagnosis not present

## 2020-07-02 DIAGNOSIS — J310 Chronic rhinitis: Secondary | ICD-10-CM

## 2020-07-02 NOTE — Progress Notes (Signed)
HPI: Anna Parker is a 24 y.o. female who presents for evaluation of headaches and postnasal drip.  She has had previous history of headaches which she feels might be related to her sinuses as she describes intermittent sinus pressure as well as headaches in the front of her head that radiate to the top of the head.  She has had history of sinus infections in the past as well as allergies.  She denies any yellow-green discharge from her nose.  Has intermittent congestion of her nose.  She uses Flonase generally at night and sometimes azelastine in the morning when allergies act up. She recently had an episode of vertigo. She is having no trouble breathing today and denies any yellow-green discharge from her nose.  The headaches may come on at any time and may last several hours.  Past Medical History:  Diagnosis Date   Abnormal uterine bleeding    Amenorrhea    Central precocious puberty ([place])    Migraine headache without aura    PCOS (polycystic ovarian syndrome)    Pituitary adenoma ([place])    Past Surgical History:  Procedure Laterality Date   left ankle fracture Left 2003   left ankle repair Left 2016   TONSILLECTOMY AND ADENOIDECTOMY     TYMPANOPLASTY     Social History   Socioeconomic History   Marital status: Single    Spouse name: Not on file   Number of children: Not on file   Years of education: Not on file   Highest education level: Not on file  Occupational History   Not on file  Tobacco Use   Smoking status: Never Smoker   Smokeless tobacco: Never Used  Vaping Use   Vaping Use: Never used  Substance and Sexual Activity   Alcohol use: Yes    Alcohol/week: 2.0 standard drinks    Types: 2 Glasses of wine per week    Comment: occasionally   Drug use: Not Currently   Sexual activity: Yes    Birth control/protection: Pill    Comment: Yasmin  Other Topics Concern   Not on file  Social History Education officer, museum at [employer].   Lives with parents and younger sister, 6 cats and 1 dog   Social Determinants of Health   Financial Resource Strain:    Difficulty of Paying Living Expenses: Not on file  Food Insecurity:    Worried About Charity fundraiser in the Last Year: Not on file   [employer] of Food in the Last Year: Not on file  Transportation Needs:    Lack of Transportation (Medical): Not on file   Lack of Transportation (Non-Medical): Not on file  Physical Activity:    Days of Exercise per Week: Not on file   Minutes of Exercise per Session: Not on file  Stress:    Feeling of Stress : Not on file  Social Connections:    Frequency of Communication with Friends and Family: Not on file   Frequency of Social Gatherings with Friends and Family: Not on file   Attends Religious Services: Not on file   Active Member of Clubs or Organizations: Not on file   Attends Archivist Meetings: Not on file   Marital Status: Not on file   Family History  Problem Relation Age of Onset   Asthma Mother    ADD / ADHD Mother    Arthritis Mother    Depression Father    Hyperlipidemia  Father    Sleep apnea Father    Arthritis Father    Hypertension Maternal Grandmother    Cancer Maternal Grandmother        Endometrial ca   Thyroid disease Maternal Grandmother        hypothyroid   Kidney failure Maternal Grandmother    Coronary artery disease Maternal Grandmother    Heart failure Maternal Grandmother    Atrial fibrillation Maternal Grandmother    Hypertension Maternal Grandfather    Cancer Maternal Grandfather        Stage IV pancreatic CA   Hyperlipidemia Paternal Grandmother    Hypothyroidism Paternal Grandmother    Depression Paternal Grandmother    Macular degeneration Paternal Grandmother    Cataracts Paternal Grandmother    Arthritis Paternal Grandfather    Depression Paternal Grandfather    Cancer Paternal Grandfather        skin cancer    Seizures Sister 2       1 seizure--unknown cause--none since   Polycystic ovary syndrome Sister    Allergies  Allergen Reactions   Amoxicillin-Pot Clavulanate    Lupron Depot [Leuprolide] Hives   Nembutal [Pentobarbital Sodium]    Prior to Admission medications   Medication Sig Start Date End Date Taking? Authorizing Provider  Ascorbic Acid (VITAMIN C PO) Take by mouth.   Yes [provider]  azelastine (ASTELIN) 0.1 % nasal spray Place into both nostrils 2 (two) times daily. Use in each nostril as directed   Yes [provider]  drospirenone-ethinyl estradiol (YASMIN) 3-0.03 MG tablet Take 1 tablet by mouth daily. 02/13/20  Yes Nunzio Cobbs, MD  ferrous sulfate 325 (65 FE) MG tablet TAKE 1 TABLET BY MOUTH EVERY DAY 03/03/20  Yes Sherrlyn Hock, MD  fluticasone [facility]) 50 MCG/ACT nasal spray Place 1 spray into both nostrils daily.   Yes [provider]  metFORMIN (GLUCOPHAGE) 500 MG tablet TAKE 1 TABLET BY MOUTH TWICE A DAY WITH A MEAL 03/13/20  Yes Sherrlyn Hock, MD  Multiple Vitamins-Minerals (ZINC PO) Take by mouth.   Yes [provider]  omeprazole (PRILOSEC) 20 MG capsule TAKE 1 CAPSULE (20 MG TOTAL) BY MOUTH 2 (TWO) TIMES DAILY BEFORE A MEAL. 04/16/20  Yes Sherrlyn Hock, MD  QUERCETIN PO Take by mouth.   Yes [provider]  SUMAtriptan (IMITREX) 100 MG tablet Take by mouth.  09/07/18  Yes [provider]  Vitamin D, Cholecalciferol, 25 MCG (1000 UT) CAPS Take by mouth.   Yes [provider]     Positive ROS: Otherwise negative  All other systems have been reviewed and were otherwise negative with the exception of those mentioned in the HPI and as above.  Physical Exam: Constitutional: Alert, well-appearing, no acute distress Ears: External ears without lesions or tenderness.  Right ear canal and right TM are clear.  Left ear canal is completely occluded with cerumen that was removed with a  suction.  The left TM was clear with good mobility on pneumatic otoscopy.  Dix-Hallpike testing revealed no evidence of BPPV. Nasal: External nose without lesions. Septum is deviated to the right with no significant spurs..  Nasal endoscopy was performed and on nasal endoscopy both middle meatus regions were clear as was the posterior ethmoid and sphenoid region.  No significant edema.  No polyps.  No mucopurulent discharge noted. Oral: Lips and gums without lesions. Tongue and palate mucosa without lesions. Posterior oropharynx clear. Neck: No palpable adenopathy or masses Respiratory:  Breathing comfortably  Skin: No facial/neck lesions or rash noted.  Cerumen impaction removal  Date/Time: 07/02/2020 9:22 AM Performed by: Rozetta Nunnery, MD Authorized by: Rozetta Nunnery, MD   Consent:    Consent obtained:  Verbal   Consent given by:  Patient   Risks discussed:  Pain and bleeding Procedure details:    Location:  L ear   Procedure type: suction   Post-procedure details:    Inspection:  TM intact and canal normal   Hearing quality:  Improved   Patient tolerance of procedure:  Tolerated well, no immediate complications Comments:     TMs are clear bilaterally.    Assessment: Chronic rhinitis. Headaches are more likely tension or migraine type headaches.  As patient had clear paranasal sinuses on nasal endoscopy.  Plan: Would recommend regular use of the Flonase and use of saline nasal rinse for postnasal drainage. If she has pressure or discomfort within the sinuses could try decongestant spray such as Afrin to see if this relieves the congestion and/or pressure. However I discussed with her that I think the headaches are probably more tension related or possibly migraine headaches and would recommend further evaluation with one of the headache specialist or neurologist.  Radene Journey, MD

## 2020-08-11 ENCOUNTER — Telehealth: Payer: Self-pay | Admitting: Family Medicine

## 2020-08-11 NOTE — Telephone Encounter (Signed)
error 

## 2020-08-11 NOTE — Telephone Encounter (Signed)
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 She has had er CPE for the year.

## 2020-08-11 NOTE — Telephone Encounter (Signed)
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   She would like to do the quantiferon tb test if her school will take that, she is going to find out.  She also needs a copy of her immunization record.   Please advise pt can be reached at the home #

## 2020-08-11 NOTE — Telephone Encounter (Signed)
Ok to schedule for [facility] as a lab visit

## 2020-08-11 NOTE — Telephone Encounter (Signed)
Appt made for 08/12/20

## 2020-08-12 ENCOUNTER — Ambulatory Visit (INDEPENDENT_AMBULATORY_CARE_PROVIDER_SITE_OTHER): Payer: PRIVATE HEALTH INSURANCE

## 2020-08-12 ENCOUNTER — Other Ambulatory Visit: Payer: Self-pay

## 2020-08-12 DIAGNOSIS — Z0184 Encounter for antibody response examination: Secondary | ICD-10-CM

## 2020-08-12 DIAGNOSIS — Z111 Encounter for screening for respiratory tuberculosis: Secondary | ICD-10-CM

## 2020-08-18 ENCOUNTER — Ambulatory Visit (INDEPENDENT_AMBULATORY_CARE_PROVIDER_SITE_OTHER): Payer: PRIVATE HEALTH INSURANCE | Admitting: "Endocrinology

## 2020-08-19 LAB — VARICELLA-ZOSTER VIRUS AB(IMMUNITY SCREEN),ACIF,SERUM: VARICELLA ZOSTER VIRUS AB (IMMUNITY SCR),ACIF SERUM: <1:4 {titer} — AB

## 2020-08-19 LAB — HEPATITIS B SURFACE ANTIBODY, QUANTITATIVE: Hep B S AB Quant (Post): 6 m[IU]/mL — ABNORMAL LOW (ref >=10)

## 2020-08-19 LAB — QUANTIFERON-TB GOLD PLUS

## 2020-08-19 LAB — MEASLES/MUMPS/RUBELLA IMMUNITY
Mumps IgG: 51.7 AU/mL
Rubella: 6.46 Index
Rubeola IgG: 156 AU/mL

## 2020-08-19 LAB — TEST AUTHORIZATION

## 2020-08-19 LAB — EXTRA SPECIMEN

## 2020-08-19 LAB — VARICELLA ZOSTER ANTIBODY, IGG: Varicella IgG: <135 index — ABNORMAL LOW

## 2020-08-25 ENCOUNTER — Encounter (INDEPENDENT_AMBULATORY_CARE_PROVIDER_SITE_OTHER): Payer: Self-pay | Admitting: "Endocrinology

## 2020-08-29 ENCOUNTER — Encounter: Payer: Self-pay | Admitting: Family Medicine

## 2020-09-03 ENCOUNTER — Telehealth: Payer: Self-pay | Admitting: Family Medicine

## 2020-09-03 NOTE — Telephone Encounter (Signed)
Patient has been scheduled for vaccines and lab for Quantiferon test

## 2020-09-03 NOTE — Telephone Encounter (Signed)
Patient states that she needs to redo her quantiferon test and also get vaccines - please advise

## 2020-09-08 ENCOUNTER — Other Ambulatory Visit: Payer: Self-pay

## 2020-09-08 ENCOUNTER — Other Ambulatory Visit: Payer: Self-pay | Admitting: Emergency Medicine

## 2020-09-08 ENCOUNTER — Ambulatory Visit (INDEPENDENT_AMBULATORY_CARE_PROVIDER_SITE_OTHER): Payer: PRIVATE HEALTH INSURANCE

## 2020-09-08 DIAGNOSIS — Z23 Encounter for immunization: Secondary | ICD-10-CM

## 2020-09-08 DIAGNOSIS — Z111 Encounter for screening for respiratory tuberculosis: Secondary | ICD-10-CM

## 2020-09-08 MED ORDER — HEPATITIS B VAC RECOMBINANT 10 MCG/ML IJ SUSP
1.0000 mL | Freq: Once | INTRAMUSCULAR | Status: AC
  Administered 2020-09-08: 10 ug via INTRAMUSCULAR

## 2020-09-08 NOTE — Progress Notes (Signed)
Anna Parker 25 yr old female presents to office today for Hep B and Chickenpox vaccine per Annye Asa, MD. Administered HEPLISAV-B 0.5 mL IM left arm and VARIVAX 0.5 mL subcu right arm. Patient tolerated well.

## 2020-09-10 LAB — QUANTIFERON-TB GOLD PLUS
Mitogen-NIL: >10 IU/mL
NIL: 0.05 IU/mL
QuantiFERON-TB Gold Plus: NEGATIVE
TB1-NIL: 0.01 IU/mL
TB2-NIL: 0.02 IU/mL

## 2020-10-08 ENCOUNTER — Encounter: Payer: Self-pay | Admitting: Family Medicine

## 2020-10-08 ENCOUNTER — Telehealth (INDEPENDENT_AMBULATORY_CARE_PROVIDER_SITE_OTHER): Payer: PRIVATE HEALTH INSURANCE | Admitting: Family Medicine

## 2020-10-08 VITALS — Wt 198.0 lb

## 2020-10-08 DIAGNOSIS — J302 Other seasonal allergic rhinitis: Secondary | ICD-10-CM

## 2020-10-08 DIAGNOSIS — R0982 Postnasal drip: Secondary | ICD-10-CM

## 2020-10-08 DIAGNOSIS — J029 Acute pharyngitis, unspecified: Secondary | ICD-10-CM

## 2020-10-08 NOTE — Progress Notes (Signed)
Virtual Visit via Video Note  I connected with Anna Parker on 10/08/20 at 11:30 AM EST by a video enabled telemedicine application 2/2 [id number] pandemic and verified that I am speaking with the correct person using two identifiers.  Location patient: home Location provider:work or home office Persons participating in the virtual visit: patient, provider  I discussed the limitations of evaluation and management by telemedicine and the availability of in person appointments. The patient expressed understanding and agreed to proceed.   HPI: Patient is a 25 yo female with past medical history significant for, PCOS who was followed by Dr. Birdie Riddle and seen for acute concern.  Pt took iron pill without food, then had emesis x 2 on sunday.  Now having sore throat since Monday.  Pt with post nasal drainage.  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  R ear pain on Monday.  Endorses cough if mucus gets stuck.  Tried lozenges, throat spray, vick's vapor rub, milk, and ibuprofen. Drinking lots of water helps with her throat.  On omeprazole for acid reflux.  Denies white patches in the back throat.  Has episodes of loose stools and consitpation, but attributes it to her metformin 500 mg.  ROS: See pertinent positives and negatives per HPI.  Past Medical History:  Diagnosis Date  . Abnormal uterine bleeding   . Amenorrhea   . Central precocious puberty ([place])   . Migraine headache without aura   . PCOS (polycystic ovarian syndrome)   . Pituitary adenoma [facility])     Past Surgical History:  Procedure Laterality Date  . left ankle fracture Left 2003  . left ankle repair Left 2016  . TONSILLECTOMY AND ADENOIDECTOMY    . TYMPANOPLASTY      Family History  Problem Relation Age of Onset  . Asthma Mother   . ADD / ADHD Mother   . Arthritis Mother   . Depression Father   . Hyperlipidemia Father   . Sleep apnea Father   . Arthritis Father   . Hypertension  Maternal Grandmother   . Cancer Maternal Grandmother        Endometrial ca  . Thyroid disease Maternal Grandmother        hypothyroid  . Kidney failure Maternal Grandmother   . Coronary artery disease Maternal Grandmother   . Heart failure Maternal Grandmother   . Atrial fibrillation Maternal Grandmother   . Hypertension Maternal Grandfather   . Cancer Maternal Grandfather        Stage IV pancreatic CA  . Hyperlipidemia Paternal Grandmother   . Hypothyroidism Paternal Grandmother   . Depression Paternal Grandmother   . Macular degeneration Paternal Grandmother   . Cataracts Paternal Grandmother   . Arthritis Paternal Grandfather   . Depression Paternal Grandfather   . Cancer Paternal Grandfather        skin cancer  . Seizures Sister 2       1 seizure--unknown cause--none since  . Polycystic ovary syndrome Sister     Current Outpatient Medications:  .  Ascorbic Acid (VITAMIN C PO), Take by mouth., Disp: , Rfl:  .  azelastine (ASTELIN) 0.1 % nasal spray, Place into both nostrils 2 (two) times daily. Use in each nostril as directed, Disp: , Rfl:  .  drospirenone-ethinyl estradiol (YASMIN) 3-0.03 MG tablet, Take 1 tablet by mouth daily., Disp: 84 tablet, Rfl: 3 .  ferrous sulfate 325 (65 FE) MG tablet, TAKE 1 TABLET BY MOUTH EVERY DAY, Disp: 90 tablet,  Rfl: 1 .  fluticasone (FLONASE) 50 MCG/ACT nasal spray, Place 1 spray into both nostrils daily., Disp: , Rfl:  .  metFORMIN (GLUCOPHAGE) 500 MG tablet, TAKE 1 TABLET BY MOUTH TWICE A DAY WITH A MEAL, Disp: 60 tablet, Rfl: 5 .  Multiple Vitamins-Minerals (ZINC PO), Take by mouth., Disp: , Rfl:  .  omeprazole (PRILOSEC) 20 MG capsule, TAKE 1 CAPSULE (20 MG TOTAL) BY MOUTH 2 (TWO) TIMES DAILY BEFORE A MEAL., Disp: 180 capsule, Rfl: 1 .  QUERCETIN PO, Take by mouth., Disp: , Rfl:  .  SUMAtriptan (IMITREX) 100 MG tablet, Take by mouth. , Disp: , Rfl:  .  Vitamin D, Cholecalciferol, 25 MCG (1000 UT) CAPS, Take by mouth., Disp: , Rfl:    EXAM:  VITALS per patient if applicable: RR between 12 to 20 bpm  GENERAL: alert, oriented, appears well and in no acute distress  HEENT: atraumatic, conjunctiva clear, no obvious abnormalities on inspection of external nose and ears  NECK: normal movements of the head and neck  LUNGS: on inspection no signs of respiratory distress, breathing rate appears normal, no obvious gross SOB, gasping or wheezing  CV: no obvious cyanosis  MS: moves all visible extremities without noticeable abnormality  PSYCH/NEURO: pleasant and cooperative, no obvious depression or anxiety, speech and thought processing grossly intact  ASSESSMENT AND PLAN:  Discussed the following assessment and plan:  Post-nasal drip -2/2 allergies -Discussed restarting allergy medications such as Claritin and Flonase. -Continue to monitor  Pharyngitis, unspecified etiology -Given 2/2 irritation from post nasal drainage.  Acute strep pharyngitis less likely given cough. -Supportive care including Flonase, gargling with warm salt water Chloraseptic spray, NSAIDs. -Start allergy medication such as Claritin -Continue to monitor  Seasonal allergies -pt to restart Claritin and flonase. -offered rx, but has meds available. -consider saline nasal rinse and local honey  F/u prn   I discussed the assessment and treatment plan with the patient. The patient was provided an opportunity to ask questions and all were answered. The patient agreed with the plan and demonstrated an understanding of the instructions.   The patient was advised to call back or seek an in-person evaluation if the symptoms worsen or if the condition fails to improve as anticipated.  Billie Ruddy, MD

## 2020-11-12 ENCOUNTER — Other Ambulatory Visit (INDEPENDENT_AMBULATORY_CARE_PROVIDER_SITE_OTHER): Payer: Self-pay | Admitting: "Endocrinology

## 2020-11-12 DIAGNOSIS — R7303 Prediabetes: Secondary | ICD-10-CM

## 2020-12-08 ENCOUNTER — Other Ambulatory Visit (INDEPENDENT_AMBULATORY_CARE_PROVIDER_SITE_OTHER): Payer: Self-pay | Admitting: "Endocrinology

## 2020-12-08 DIAGNOSIS — R7303 Prediabetes: Secondary | ICD-10-CM

## 2020-12-08 NOTE — Progress Notes (Signed)
Subjective:  Subjective  Patient Name: Anna Parker Date of Birth: 1996-01-16  MRN: [id number]  Anna Parker  presents to the office today for follow-up evaluation and management of her history of goiter, oligomenorrhea, elevated testosterone, PCOS, hirsutism, fatigue, dyspepsia, and pituitary cyst/area of differential enhancement.  HISTORY OF PRESENT ILLNESS:   Anna Parker is a 25 y.o. Caucasian young lady.   Anna Parker was unaccompanied.  40. Anna Parker is a 25 y.o. young lady who was followed from an early age for precocious  pubertal development.   A. The child started [facility] agonist therapy as a very young child. I took care of her from 2006 to early 2012. At about that time she stopped her [facility] agonist therapy.    B. As part of her evaluation for precocity, Anna Parker had an MRI scan of her brain, performed with and without contrast in 2006, which revealed an "oval area of differential enhancement measuring 5.8 x 2.9 x 3.2 mm which was c/w a pituitary cyst, Rathke's cleft cyst, or small pituitary microadenoma".  There was also a "tiny area of increased signal on precontrast T1-weighted images along the mid to lower infundibulum".   C. A follow up brain MRI scan was performed with and without contrast in 2008. The radiologist stated that, "The previously identified cystic structure has involuted and the gland is now normal. There was a tiny questioned focus of T1 shortening on precontrast images near the infundibulum. I believe this are too has normalized. It may have represented a partial volume averaging of the clivus."   2. On 01/17/14 Anna Parker returned to our clinic, after a 3-year hiatus, for re-evaluation of her pituitary lesion and was seen by Dr. Baldo Ash. [name] had been discovered to have a benign tumor of the pituitary that had damaged his vision. Dr. Everitt Amber, our pediatric ophthalmologist, felt that she deserved an updated pituitary evaluation given the family history and her personal  history of pituitary findings. Mom also stated that they were told at their last endocrine visit that she would need repeat imaging in 5 years.   A. She had achieved a normal adult height and was at a healthy weight for her height. She reported normal menses every 28-36 days. She had not had significant changes in her vision, severe headaches, or galactorrhea.   B. A follow up MRI, with and without contrast, was performed on 01/27/14. Dr. [name] saw a 5 mm area of differential enhancement posterior to the insertion of the infundibulum. This area was smaller than in 2006 and was less intense than it was on the pre-contrast images from the MRI in 2008. 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 The appearance is not typical for pituitary microadenoma given the location posterior to the adenohypophysis.".   C. Subsequent follow up MRI on 12/28/15 showed that she had a 6.5 mm lesion posterior to the adenohypophysis. 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   3. Anna Parker's last [facility] visit occurred on 02/18/20. In the interim she has been healthy. At that visit I continued her metformin 500 mg twice daily, but she ran out of omeprazole, 20 mg, twice daily several weeks ago. I suggested again that she take a good MVI with iron, such a Centrum for Women or One-A-Day for Women.  A. In the interim she has been healthy. Her energy level is "low at the end of the week" because she is not getting enough sleep when she  commutes to [employer].    B. She has not been trying to Eat Right as often.   C. She has not been exercising.   D. She is taking metformin, 500 mg, twice daily. She is also taking the one ferrous sulfate tablet daily.  She sometimes still misses doses of her meds.   E. She has had headaches about once a month, sometimes related to academic stress, but sometimes occurring during the pre-menstrual period.     4. Pertinent Review of Systems:   Constitutional: The patient feels "pretty good". She is working too hard. She is doing "really good" emotionally. She has a good relationship with her boyfriend.   Eyes: Her eye glasses and contacts are working well. There are no other recognized eye problems.  Neck: The patient has no complaints of anterior neck swelling, soreness, tenderness, pressure, discomfort, or difficulty swallowing.   Heart: Heart rate increases with exercise or other physical activity. The patient has no complaints of palpitations, irregular heart beats, chest pain, or chest pressure.   Gastrointestinal: [name] is not having much belly hunger. She also feels full faster. Caffeine causes more frequent BMs. The patient has no complaints of acid reflux, upset stomach, stomach aches or pains, or constipation.  Hands: No problems Legs: She still has some "arthritis" in her left ankle. Muscle mass and strength seem normal. There are no other complaints of numbness, tingling, burning, or pain. No edema is noted.  Feet: There are no other complaints of numbness, tingling, burning, or pain. No edema is noted. Neurologic: There are no recognized problems with muscle movement and strength, sensation, or coordination. GYN: Her LMP was three weeks ago.  Menses occur regularly now and are not unusually heavy.  She is still on OCPs.  Skin: Since starting OCPs she does not have as much facial hair, so she does not often have to shave the corners of her mouth very often. The abdominal hair is still there.    PAST MEDICAL, FAMILY, AND SOCIAL HISTORY  Past Medical History:  Diagnosis Date  . Abnormal uterine bleeding   . Amenorrhea   . Central precocious puberty ([place])   . Migraine headache without aura   . PCOS (polycystic ovarian syndrome)   . Pituitary adenoma ([place])     Family History  Problem Relation Age of Onset  . Asthma Mother   . ADD / ADHD Mother   . Arthritis Mother   . Depression Father   . Hyperlipidemia Father   .  Sleep apnea Father   . Arthritis Father   . Hypertension Maternal Grandmother   . Cancer Maternal Grandmother        Endometrial ca  . Thyroid disease Maternal Grandmother        hypothyroid  . Kidney failure Maternal Grandmother   . Coronary artery disease Maternal Grandmother   . Heart failure Maternal Grandmother   . Atrial fibrillation Maternal Grandmother   . Hypertension Maternal Grandfather   . Cancer Maternal Grandfather        Stage IV pancreatic CA  . Hyperlipidemia Paternal Grandmother   . Hypothyroidism Paternal Grandmother   . Depression Paternal Grandmother   . Macular degeneration Paternal Grandmother   . Cataracts Paternal Grandmother   . Arthritis Paternal Grandfather   . Depression Paternal Grandfather   . Cancer Paternal Grandfather        skin cancer  . Seizures Sister 2       1 seizure--unknown cause--none since  . Polycystic ovary  syndrome Sister    S  Current Outpatient Medications:  .  drospirenone-ethinyl estradiol (YASMIN) 3-0.03 MG tablet, Take 1 tablet by mouth daily., Disp: 84 tablet, Rfl: 3 .  ferrous sulfate 325 (65 FE) MG tablet, TAKE 1 TABLET BY MOUTH EVERY DAY, Disp: 90 tablet, Rfl: 1 .  metFORMIN (GLUCOPHAGE) 500 MG tablet, TAKE 1 TABLET BY MOUTH TWICE A DAY WITH MEALS, Disp: 60 tablet, Rfl: 0 .  Ascorbic Acid (VITAMIN C PO), Take by mouth. (Patient not taking: Reported on 12/09/2020), Disp: , Rfl:  .  azelastine (ASTELIN) 0.1 % nasal spray, Place into both nostrils 2 (two) times daily. Use in each nostril as directed (Patient not taking: Reported on 12/09/2020), Disp: , Rfl:  .  fluticasone (FLONASE) 50 MCG/ACT nasal spray, Place 1 spray into both nostrils daily. (Patient not taking: Reported on 12/09/2020), Disp: , Rfl:  .  Multiple Vitamins-Minerals (ZINC PO), Take by mouth. (Patient not taking: Reported on 12/09/2020), Disp: , Rfl:  .  omeprazole (PRILOSEC) 20 MG capsule, TAKE 1 CAPSULE (20 MG TOTAL) BY MOUTH 2 (TWO) TIMES DAILY BEFORE A MEAL.  (Patient not taking: Reported on 12/09/2020), Disp: 180 capsule, Rfl: 1 .  QUERCETIN PO, Take by mouth. (Patient not taking: Reported on 12/09/2020), Disp: , Rfl:  .  SUMAtriptan (IMITREX) 100 MG tablet, Take by mouth.  (Patient not taking: Reported on 12/09/2020), Disp: , Rfl:  .  Vitamin D, Cholecalciferol, 25 MCG (1000 UT) CAPS, Take by mouth. (Patient not taking: Reported on 12/09/2020), Disp: , Rfl:   Allergies as of 12/09/2020 - Review Complete 12/09/2020  Allergen Reaction Noted  . Amoxicillin-pot clavulanate  02/15/2011  . Lupron depot [leuprolide] Hives 10/28/2016  . Nembutal [pentobarbital sodium]  02/15/2011     reports that she has never smoked. She has never used smokeless tobacco. She reports current alcohol use of about 2.0 standard drinks of alcohol per week. She reports previous drug use. Pediatric History  Patient Parents  . Nield,[name] (Mother)  . Leedy,Charles (Father)   Other Topics Concern  . Not on file  Social History Narrative   Senior at [employer].   Lives with parents and younger sister, 6 cats and 1 dog   School: She finished her undergrad work at [employer] in [place], [place]. She is now taking nursing courses now through [employer] at the campus in [place]. 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  Physical activities: She has been less physically active.  Primary Care Provider: Midge Minium, MD  REVIEW OF SYSTEMS: There are no other significant problems involving Tyshia's other body systems.    Objective:  Objective  Vital Signs:  BP 118/74 (BP Location: Right Arm, Patient Position: Sitting, Cuff Size: Normal)   Pulse 64   Ht 5' 6.38" (1.686 m)   Wt 201 lb 6.4 oz (91.4 kg)   BMI 32.14 kg/m    Ht Readings from Last 3 Encounters:  12/09/20 5' 6.38" (1.686 m)  05/02/20 5\' 7"  (6.010 m)  93/23/55 5' 6.73" (1.695 m)   Wt  Readings from Last 3 Encounters:  12/09/20 201 lb 6.4 oz (91.4 kg)  10/08/20 198 lb (89.8 kg)  05/02/20 198 lb 2 oz (89.9 kg)   HC Readings from Last 3 Encounters:  No data found for [facility]   Body surface area is 2.07 meters squared. Facility age limit for growth percentiles  is 20 years. Facility age limit for growth percentiles is 20 years.    PHYSICAL EXAM:  Constitutional: The patient appears healthy, but a bit heavier. Her height has plateaued. She has gained 6 pound in the past 10 months She is 71 pounds above her Ideal Body Weight of 130 pounds.  She is bright, alert, and looks good today. She is very mature.   Head: The head is normocephalic. Face: The face appears normal. There are no obvious dysmorphic features. She still has grade 2 sideburns.  Eyes: The eyes appear to be normally formed and spaced. Gaze is conjugate. There is no obvious arcus or proptosis. Moisture appears normal. Ears: The ears are normally placed and appear externally normal. Mouth: The oropharynx and tongue appear normal. Dentition appears to be normal for age. Oral moisture is normal. There is no oral  hyperpigmentation.  Neck: The neck appears to be visibly normal. The thyroid gland is again mildly increased in size at about 20+ grams. Today both lobes are symmetrically enlarged. The consistency of the lobes is normal.  The thyroid gland is not tender to palpation. Lungs: The lungs are clear to auscultation. Air movement is good. Heart: Heart rate and rhythm are regular. Heart sounds S1 and S2 are normal. I did not appreciate any pathologic cardiac murmurs. Abdomen: The abdomen is enlarged. Bowel sounds are normal. There is no obvious hepatomegaly, splenomegaly, or other mass effect.  Arms: Muscle size and bulk are normal for age. Hands: There is no obvious tremor. Phalangeal and metacarpophalangeal joints are normal. Palmar muscles are normal for age. Palmar skin is normal. Palmar moisture is also normal.  There is no palmar hyperpigmentation. She has no pallor of her fingernails today. Legs: Muscles appear normal for age.  Neurologic: Strength is normal for age in both the upper and lower extremities. Muscle tone is normal. Sensation to touch is normal in both legs.    LAB DATA:   Results for orders placed or performed in visit on 12/09/20 (from the past 672 hour(s))  POCT Glucose (Device for Home Use)   Collection Time: 12/09/20  2:36 PM  Result Value Ref Range   Glucose Fasting, POC     POC Glucose 88 70 - 99 mg/dl    Labs 12/09/20: HbA1c 4.9%, CBG 88  Labs 05/02/20: TSH 1.93, BMP normal; Hepatic function panel normal; CBC normal; cholesterol 234, triglycerides 94, HDL 81.90, LDL 133  Labs 02/18/20: HbA1c 4.9%; CBG 91; CBC normal; iron 109  Labs 08/20/19: HbA1c 5.1%, CBG 100; TSH 1.58, free T4 1.1, free T3 3.3, anti-thyroid antibodies negative; CBC normal, except MCH 26.5 (27-33), iron 36 (ref 40-190)   Labs 03/21/19: HbA1c 5.2%, CBG 117; TSH 2.31, free T4 1.1, free T3 3.2; CBC normal, except MCH 26.7 (ref 27-33); iron 69;   Labs 08/17/18: HbA1c 5.0%, CBG 101; CBC normal except MCH 26,7 (ref 27-33); iron 34 (ref 27-164)  MCH 26,.5  Labs 03/718: HbA1c 5.1%, CBG 107; TSH 1.49, free T4 1.2, free T3 3.3; CMP normal; CBC normal, except MCH 26.9 (ref 27-33)  Labs 01/28/17: HbA1c 4.9%, CBG 88  Labs 01/12/17: TSH 1.49, free T4 1.1, free T3 3.0  Labs 10/20/16: HbA1c 5.1%, CBG 88  Labs 07/19/16: HbA1c 5.2%  Labs 01/14/16: HbA1c 5.0%  Labs 01/03/16: TSH 1.58, free T4 1.1, free T3 2.9; CBC normal; iron 56  Labs 10/14/15: HbA1c 4.8%  Labs 07/14/15: HbA1c 5.2%  Labs 04/17/15: CMP normal; IGF-1 279, IGFBP-3 5.1  Labs 02/18/15: TSH 1.859, free  T4 0.90, free T3 3.5; LH 16, FSH 6.4, testosterone 62, estradiol 45.9; ACTH 18 (normal 9-57), cortisol 12.4 (normal 4.2-22.4); prolactin 7.0; CBC normal; iron 118 (normal 42-145)  IMAGING:   MRI 09/09/18: There is a 6 mm, well-circumscribed T1  hyperintense ovoid lesion at the posterior left aspect of the sella, most 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    MRI 12/28/15: "Stable 6 mm hyperintense cystic lesion in the posterior sella. 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 There is no expansion of the sella. Pituitary neoplasm is considered unlikely. Calcification or ossification along the posterior meningeal falx. This has not significantly changed. There is no enhancement. This most likely represents benign ossification rather than a meningioma. Recommend 2 year follow-up to assure stability of this cyst given the patient's age. If the cyst remains stable following puberty, further follow-up may not be necessary.   Assessment and Plan:  Assessment  ASSESSMENT:  1. History of pituitary cyst/abnormal enhancement/brain cyst:   A. This area was smaller on her MRI in 2015 than it was in 2006 and 2008. Dr. [name], a very experienced neuroradiologist, felt that this area did not represent a pituitary tumor per se. She did not have any excessive pituitary hormone secretions. It was highly unlikely that this area was causing her fatigue or oligomenorrhea. However, Dr. [name] also mentioned an area of calcification of the mid-to-posterior falx and recommended follow up.  B. In 2017 her MRI was essentially unchanged. Dr. Jobe Igo was quite comfortable that the 6.5 mm lesion was a Rathke's cleft cyst. He was also comfortable with the ossifications being benign.   C. As can be seen in her labs from June and August 2016, this lesion was non-functional, that is, it was not producing any excess amounts of the six anterior pituitary hormones. This lesion was also definitely posterior to the adenohypophysis, not part of the pituitary gland as her grand uncle's tumor was said to be.  D. Her MRI performed in January 2020 was again c/w being a small, Rathke's cleft cyst.   E. Her headaches are  less frequent and are related to stress and the premenstrual periods.   2. Overweight: This problem is a bit worse. She is still taking in more calories than she burns off.  3. Goiter/thyroiditis:   A. Her thyroid gland had shrunk back to normal size at her December 2019 visit, had increased in size at her July and December 2020 visits, but has decreased in size a bit since then and remains mildly enlarged today in April 2022. The process of waxing and waning of thyroid gland size is c/w evolving Hashimoto's thyroiditis.   B. She was euthyroid in May 2017 and again in May 2018. She was mid-euthyroid in August 2019. Her TFTs in July 2020 had decreased to about the 25% of the physiologic range. Her TFTS in December 2020 were again mid-euthyroid. Her TSH of 1.93 in September 2021 was within the goal range of 1.0-2.0.  4. Oligomenorrhea/PCOS/elevated testosterone, hirsutism:   A. The oligomenorrhea resolved with weight loss, but may recur with weight gain.   B. The oligomenorrhea was likely due to her obesity and PCOS/Stein-Leventhal Syndrome (SLS). Her LH/FSH ratio was elevated in June 2016, c/w PCOS/SLS. Her testosterone/estradiol ratio was similarly elevated, also c/w PCOS/SLS. Her prolactin was normal.    C. Her facial hair and abdominal hair have markedly decreased since starting OCPs. Mom has the same problem, as  does mom's mother. Dreyah's hirsutism is not apparent today.  5. Fatigue, other:   A. This problem has recurred, but now os due to not sleeping enough when she is commuting to [place] twice a week.   B. Her TFTs, CBC, iron level, ACTH, and cortisol were all normal in June 2016. We did not find a hormonal cause of her fatigue.  6. Dyspepsia: She has somewhat more dyspepsia since being off omeprazole.  7. Pallor: Resolved. Her CBC and iron concentration were normal in May 2017.  Her CBC was normal in September 2021. She is not pallid today. 8. Abnormal MCH/abnormal red blood cell:   A. Her  MCH was low in August and in December 2019, but all of her other RBC indices were normal. Her [facility] was also mildly low in July and December 2020.   B. Her iron was low-normal in July 2020 and low in December 2020. She needed to take her ferrous sulfate. We need to repeat her CBC and iron now.   C. I suspect that Marialy is one of those people who do not absorb iron as readily from food. If so, she will need to take a good quality MVI with iron every day.  PLAN:  1. Diagnostic: HbA1c and CBG, TFTs, CBC, and iron today.  2. Therapeutic: Continue metformin, 500 mg, twice daily. Re-start omeprazole, 20 mg, twice daily. Continue to Eat Right. Try to exercise for one hour per day.  Take either Centrum for Women or One-A-Day for Women 3. Patient education: I reviewed her medical problems, to include her obesity, PCOS, elevated testosterone, female hirsutism, and the apparent Rathke's cleft cyst. I discussed the fact that obesity is a major risk factor for developing T2DM, hyperlipidemias, cardiovascular disease, and cancer. We also discussed her goiter and Hashimoto's disease. We discussed the effects of OCPs on her facial hair issue. Raziya again asked appropriate questions and seemed very pleased with discussion and plan.  4. Follow-up: 6 months    Level of Service: This visit lasted in excess of 65 minutes. More than 50% of the visit was devoted to counseling.  Tillman Sers, MD, CDE Pediatric and Adult Endocrinology

## 2020-12-09 ENCOUNTER — Other Ambulatory Visit (INDEPENDENT_AMBULATORY_CARE_PROVIDER_SITE_OTHER): Payer: Self-pay | Admitting: *Deleted

## 2020-12-09 ENCOUNTER — Ambulatory Visit (INDEPENDENT_AMBULATORY_CARE_PROVIDER_SITE_OTHER): Payer: BC Managed Care – PPO | Admitting: "Endocrinology

## 2020-12-09 ENCOUNTER — Encounter (INDEPENDENT_AMBULATORY_CARE_PROVIDER_SITE_OTHER): Payer: Self-pay | Admitting: *Deleted

## 2020-12-09 ENCOUNTER — Other Ambulatory Visit (INDEPENDENT_AMBULATORY_CARE_PROVIDER_SITE_OTHER): Payer: Self-pay | Admitting: "Endocrinology

## 2020-12-09 ENCOUNTER — Other Ambulatory Visit: Payer: Self-pay

## 2020-12-09 ENCOUNTER — Telehealth (INDEPENDENT_AMBULATORY_CARE_PROVIDER_SITE_OTHER): Payer: Self-pay | Admitting: "Endocrinology

## 2020-12-09 VITALS — BP 118/74 | HR 64 | Ht 66.38 in | Wt 201.4 lb

## 2020-12-09 DIAGNOSIS — R1013 Epigastric pain: Secondary | ICD-10-CM | POA: Diagnosis not present

## 2020-12-09 DIAGNOSIS — Z6831 Body mass index (BMI) 31.0-31.9, adult: Secondary | ICD-10-CM

## 2020-12-09 DIAGNOSIS — E049 Nontoxic goiter, unspecified: Secondary | ICD-10-CM | POA: Diagnosis not present

## 2020-12-09 DIAGNOSIS — E282 Polycystic ovarian syndrome: Secondary | ICD-10-CM

## 2020-12-09 DIAGNOSIS — E669 Obesity, unspecified: Secondary | ICD-10-CM | POA: Diagnosis not present

## 2020-12-09 DIAGNOSIS — E6609 Other obesity due to excess calories: Secondary | ICD-10-CM

## 2020-12-09 DIAGNOSIS — R5383 Other fatigue: Secondary | ICD-10-CM

## 2020-12-09 DIAGNOSIS — R7303 Prediabetes: Secondary | ICD-10-CM

## 2020-12-09 DIAGNOSIS — R231 Pallor: Secondary | ICD-10-CM

## 2020-12-09 LAB — POCT GLUCOSE (DEVICE FOR HOME USE): POC Glucose: 88 mg/dl (ref 70–99)

## 2020-12-09 LAB — POCT GLYCOSYLATED HEMOGLOBIN (HGB A1C): Hemoglobin A1C: 4.9 % (ref 4.0–5.6)

## 2020-12-09 MED ORDER — OMEPRAZOLE 20 MG PO CPDR: 20.0000 mg | DELAYED_RELEASE_CAPSULE | Freq: Two times a day (BID) | ORAL | 1 refills | Status: DC

## 2020-12-09 MED ORDER — METFORMIN HCL 500 MG PO TABS: ORAL_TABLET | ORAL | 1 refills | Status: DC

## 2020-12-09 NOTE — Telephone Encounter (Signed)
New scripts sent for 90 days, mychart message sent.

## 2020-12-09 NOTE — Telephone Encounter (Signed)
Patient forgot to tell provider in visit today that 90 prescriptions were needed for insurance.

## 2020-12-09 NOTE — Patient Instructions (Signed)
Follow up visit in 6 months. 

## 2020-12-10 LAB — CBC WITH DIFFERENTIAL/PLATELET
Absolute Monocytes: 604 cells/uL (ref 200–950)
Basophils Absolute: 50 cells/uL (ref 0–200)
Basophils Relative: 0.5 %
Eosinophils Absolute: 50 cells/uL (ref 15–500)
Eosinophils Relative: 0.5 %
HCT: 44.4 % (ref 35.0–45.0)
Hemoglobin: 14.3 g/dL (ref 11.7–15.5)
Lymphs Abs: 2990 cells/uL (ref 850–3900)
MCH: 26.4 pg — ABNORMAL LOW (ref 27.0–33.0)
MCHC: 32.2 g/dL (ref 32.0–36.0)
MCV: 82.1 fL (ref 80.0–100.0)
MPV: 11.5 fL (ref 7.5–12.5)
Monocytes Relative: 6.1 %
Neutro Abs: 6207 cells/uL (ref 1500–7800)
Neutrophils Relative %: 62.7 %
Platelets: 249 10*3/uL (ref 140–400)
RBC: 5.41 10*6/uL — ABNORMAL HIGH (ref 3.80–5.10)
RDW: 13.3 % (ref 11.0–15.0)
Total Lymphocyte: 30.2 %
WBC: 9.9 10*3/uL (ref 3.8–10.8)

## 2020-12-10 LAB — TSH: TSH: 1.25 mIU/L

## 2020-12-10 LAB — T4, FREE: Free T4: 1.2 ng/dL (ref 0.8–1.8)

## 2020-12-10 LAB — IRON: Iron: 61 ug/dL (ref 40–190)

## 2020-12-10 LAB — T3, FREE: T3, Free: 3.3 pg/mL (ref 2.3–4.2)

## 2020-12-15 ENCOUNTER — Encounter (INDEPENDENT_AMBULATORY_CARE_PROVIDER_SITE_OTHER): Payer: Self-pay | Admitting: *Deleted

## 2021-01-09 ENCOUNTER — Other Ambulatory Visit: Payer: Self-pay

## 2021-01-09 NOTE — Telephone Encounter (Signed)
Last AEX 02/13/20 Scheduled AEX 02/16/21

## 2021-01-12 MED ORDER — DROSPIRENONE-ETHINYL ESTRADIOL 3-0.03 MG PO TABS: 1.0000 | ORAL_TABLET | Freq: Every day | ORAL | 0 refills | Status: DC

## 2021-02-12 NOTE — Progress Notes (Signed)
25 y.o. G0P0000 Single Caucasian female here for annual exam.   No period problems.  Wants to continue Yasmin.   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  Feels like she is studying a lot.  Wants to start an exercise routine.  Now living with her boyfriend.   PCP:   Annye Asa, MD  Patient's last menstrual period was 01/26/2021 (approximate).     Period Cycle (Days): 30 Period Duration (Days): 3-4 Period Pattern: Regular Menstrual Flow: Moderate Menstrual Control: Maxi pad Menstrual Control Change Freq (Hours): changes maxi pad every 2-3 hours on heaviest day for comfort Dysmenorrhea:  (cramps 1 day prior to menses)     Sexually active: Yes.    The current method of family planning is OCP (estrogen/progesterone)--Yasmin.    Exercising: No.  The patient does not participate in regular exercise at present. Smoker:  no  Health Maintenance: Pap: 02-03-20 Neg History of abnormal Pap:  no MMG:  n/a Colonoscopy:  n/a BMD:   n/a  Result  n/a TDaP:  2018 Gardasil:   yes, completed HIV:02-13-20 NR Hep C:02-13-20 Neg Screening Labs:  PCP   reports that she has never smoked. She has never used smokeless tobacco. She reports current alcohol use of about 1.0 standard drink of alcohol per week. She reports previous drug use.  Past Medical History:  Diagnosis Date   Abnormal uterine bleeding    Amenorrhea    Central precocious puberty ([place])    Migraine headache without aura    PCOS (polycystic ovarian syndrome)    Pituitary adenoma ([place])     Past Surgical History:  Procedure Laterality Date   left ankle fracture Left 2003   left ankle repair Left 2016   TONSILLECTOMY AND ADENOIDECTOMY     TYMPANOPLASTY      Current Outpatient Medications  Medication Sig Dispense Refill   azelastine (ASTELIN) 0.1 % nasal spray Place into both nostrils 2 (two) times daily. Use in each nostril as directed     drospirenone-ethinyl estradiol (YASMIN) 3-0.03 MG tablet Take 1 tablet by mouth  daily. 84 tablet 0   ferrous sulfate 325 (65 FE) MG tablet TAKE 1 TABLET BY MOUTH EVERY DAY 90 tablet 1   fluticasone (FLONASE) 50 MCG/ACT nasal spray Place 1 spray into both nostrils daily.     ibuprofen (ADVIL) 200 MG tablet Take 200 mg by mouth every 6 (six) hours as needed.     metFORMIN (GLUCOPHAGE) 500 MG tablet TAKE 1 TABLET BY MOUTH TWICE A DAY WITH MEALS 180 tablet 1   Multiple Vitamins-Minerals (ZINC PO) Take by mouth.     omeprazole (PRILOSEC) 40 MG capsule Take 1 capsule daily 30 capsule 2   No current facility-administered medications for this visit.    Family History  Problem Relation Age of Onset   Asthma Mother    ADD / ADHD Mother    Arthritis Mother    Depression Father    Hyperlipidemia Father    Sleep apnea Father    Arthritis Father    Hypertension Maternal Grandmother    Cancer Maternal Grandmother        Endometrial ca   Thyroid disease Maternal Grandmother        hypothyroid   Kidney failure Maternal Grandmother    Coronary artery disease Maternal Grandmother    Heart failure Maternal Grandmother    Atrial fibrillation Maternal Grandmother    Hypertension Maternal Grandfather    Cancer Maternal Grandfather        Stage  IV pancreatic CA   Hyperlipidemia Paternal Grandmother    Hypothyroidism Paternal Grandmother    Depression Paternal Grandmother    Macular degeneration Paternal Grandmother    Cataracts Paternal Grandmother    Arthritis Paternal Grandfather    Depression Paternal Grandfather    Cancer Paternal Grandfather        skin cancer   Seizures Sister 2       1 seizure--unknown cause--none since   Polycystic ovary syndrome Sister     Review of Systems  All other systems reviewed and are negative.  Exam:   BP 112/60   Pulse 77   Ht 5' 6.5" (1.689 m)   Wt 207 lb (93.9 kg)   LMP 01/26/2021 (Approximate)   SpO2 99%   BMI 32.91 kg/m     General appearance: alert, cooperative and appears stated age Head: normocephalic, without obvious  abnormality, atraumatic Neck: no adenopathy, supple, symmetrical, trachea midline and thyroid normal to inspection and palpation Lungs: clear to auscultation bilaterally Breasts: normal appearance, no masses or tenderness, No nipple retraction or dimpling, No nipple discharge or bleeding, No axillary adenopathy Heart: regular rate and rhythm Abdomen: soft, non-tender; no masses, no organomegaly Extremities: extremities normal, atraumatic, no cyanosis or edema Skin: skin color, texture, turgor normal. No rashes or lesions Lymph nodes: cervical, supraclavicular, and axillary nodes normal. Neurologic: grossly normal  Pelvic: External genitalia:  no lesions              No abnormal inguinal nodes palpated.              Urethra:  normal appearing urethra with no masses, tenderness or lesions              Bartholins and Skenes: normal                 Vagina: normal appearing vagina with normal color and discharge, no lesions              Cervix: no lesions              Pap taken: No. Bimanual Exam:  Uterus:  normal size, contour, position, consistency, mobility, non-tender              Adnexa: no mass, fullness, tenderness               Chaperone was present for exam.  Assessment:   Well woman visit with normal exam. Hx central precocious puberty.  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  Stable on MRI, last done 2020.  Followed by Dr. Tobe Sos. PCOS. Migraine without aura. STD screening.   Plan: Mammogram screening discussed. Self breast awareness reviewed. Pap and HR HPV as above. Guidelines for Calcium, Vitamin D, regular exercise program including cardiovascular and weight bearing exercise. Refill of Yasmin for one year.  STD screening.  Follow up annually and prn.

## 2021-02-16 ENCOUNTER — Encounter: Payer: Self-pay | Admitting: Obstetrics and Gynecology

## 2021-02-16 ENCOUNTER — Other Ambulatory Visit (HOSPITAL_COMMUNITY)
Admission: RE | Admit: 2021-02-16 | Discharge: 2021-02-16 | Disposition: A | Discharge status: Home / Self Care | Payer: PRIVATE HEALTH INSURANCE | Source: Ambulatory Visit | Attending: Obstetrics and Gynecology | Admitting: Obstetrics and Gynecology

## 2021-02-16 ENCOUNTER — Ambulatory Visit (INDEPENDENT_AMBULATORY_CARE_PROVIDER_SITE_OTHER): Payer: PRIVATE HEALTH INSURANCE | Admitting: Obstetrics and Gynecology

## 2021-02-16 ENCOUNTER — Other Ambulatory Visit: Payer: Self-pay

## 2021-02-16 ENCOUNTER — Ambulatory Visit: Payer: PRIVATE HEALTH INSURANCE | Admitting: Obstetrics and Gynecology

## 2021-02-16 VITALS — BP 112/60 | HR 77 | Ht 66.5 in | Wt 207.0 lb

## 2021-02-16 DIAGNOSIS — Z113 Encounter for screening for infections with a predominantly sexual mode of transmission: Secondary | ICD-10-CM

## 2021-02-16 DIAGNOSIS — Z01419 Encounter for gynecological examination (general) (routine) without abnormal findings: Secondary | ICD-10-CM

## 2021-02-16 MED ORDER — DROSPIRENONE-ETHINYL ESTRADIOL 3-0.03 MG PO TABS: 1.0000 | ORAL_TABLET | Freq: Every day | ORAL | 3 refills | Status: DC

## 2021-02-16 NOTE — Patient Instructions (Signed)

## 2021-02-17 LAB — CERVICOVAGINAL ANCILLARY ONLY
Chlamydia: NEGATIVE
Comment: NEGATIVE
Comment: NEGATIVE
Comment: NORMAL
Neisseria Gonorrhea: NEGATIVE
Trichomonas: NEGATIVE

## 2021-02-17 LAB — HIV ANTIBODY (ROUTINE TESTING W REFLEX): HIV 1&2 Ab, 4th Generation: NONREACTIVE

## 2021-02-17 LAB — HEPATITIS C ANTIBODY
Hepatitis C Ab: NONREACTIVE
SIGNAL TO CUT-OFF: 0.01 (ref <1.00)

## 2021-02-17 LAB — HEPATITIS B SURFACE ANTIGEN: Hepatitis B Surface Ag: NONREACTIVE

## 2021-02-17 LAB — RPR: RPR Ser Ql: NONREACTIVE

## 2021-02-25 ENCOUNTER — Encounter: Payer: Self-pay | Admitting: *Deleted

## 2021-03-14 ENCOUNTER — Other Ambulatory Visit (INDEPENDENT_AMBULATORY_CARE_PROVIDER_SITE_OTHER): Payer: Self-pay | Admitting: "Endocrinology

## 2021-03-16 ENCOUNTER — Other Ambulatory Visit (INDEPENDENT_AMBULATORY_CARE_PROVIDER_SITE_OTHER): Payer: Self-pay | Admitting: "Endocrinology

## 2021-04-16 ENCOUNTER — Telehealth (INDEPENDENT_AMBULATORY_CARE_PROVIDER_SITE_OTHER): Payer: PRIVATE HEALTH INSURANCE | Admitting: Registered Nurse

## 2021-04-16 ENCOUNTER — Other Ambulatory Visit: Payer: Self-pay

## 2021-04-16 ENCOUNTER — Encounter: Payer: Self-pay | Admitting: Registered Nurse

## 2021-04-16 DIAGNOSIS — B9689 Other specified bacterial agents as the cause of diseases classified elsewhere: Secondary | ICD-10-CM | POA: Diagnosis not present

## 2021-04-16 DIAGNOSIS — J019 Acute sinusitis, unspecified: Secondary | ICD-10-CM | POA: Diagnosis not present

## 2021-04-16 MED ORDER — DOXYCYCLINE HYCLATE 100 MG PO TABS: 100.0000 mg | ORAL_TABLET | Freq: Two times a day (BID) | ORAL | 0 refills | Status: DC

## 2021-04-16 MED ORDER — PREDNISONE 20 MG PO TABS: 20.0000 mg | ORAL_TABLET | Freq: Every day | ORAL | 0 refills | Status: DC

## 2021-04-16 NOTE — Progress Notes (Signed)
Telemedicine Encounter- SOAP NOTE Established Patient  This telephone encounter was conducted with the patient's (or proxy's) verbal consent via audio telecommunications: yes/no: Yes Patient was instructed to have this encounter in a suitably private space; and to only have persons present to whom they give permission to participate. In addition, patient identity was confirmed by use of name plus two identifiers (DOB and address).  I discussed the limitations, risks, security and privacy concerns of performing an evaluation and management service by telephone and the availability of in person appointments. I also discussed with the patient that there may be a patient responsible charge related to this service. The patient expressed understanding and agreed to proceed.  I spent a total of 14 minutes talking with the patient or their proxy.  Patient at home Provider in office  Participants: Kathrin Ruddy, NP and [place]  Chief Complaint  Patient presents with   Sinusitis    Patient states she thinks she has had a sinus infection since last Thursday. She has been experiencing some nasal congestion, cough, coughing up phlegm, and also starting to have some upper back pain. She has been taking nasal decongestant , sudafed, mucinex , cough drops and xyzal at night.    Subjective   Anna Parker is a 25 y.o. established patient. Telephone visit today for sinus pressure  HPI Sinus pressure, congestion, cough/pnd, productive cough, and some upper back pain Has been taking OTCs - flonase, sudafed, mucinex, and xyzal. Also using lozenges These have provided some temporary relief but nothing that lasts too long. Symptoms have been stable.  Chronic infections in the past. She does have a deviated septum Sister had had recent infection - just got past this  No fevers, chills, fatigue, sweats, nvd, or other symptoms.  Notes she did just finish final exams for school  - been operating on less sleep than ideal, commuting to [place] and back three days in a row  Patient Active Problem List   Diagnosis Date Noted   Physical exam 04/30/2019   Migraine 11/15/2018   Elevated testosterone level in female 01/30/2017   Female hirsutism 01/30/2017   PCOS (polycystic ovarian syndrome) 01/30/2017   Mass of posterior pituitary ([place]) 01/14/2016   Dyspepsia 03/26/2015   Obesity 02/17/2015   Oligomenorrhea 02/17/2015   Other fatigue 02/17/2015   Goiter 02/17/2015   Cavus deformity of foot 10/30/2013   Abnormality of gait 10/30/2013    Past Medical History:  Diagnosis Date   Abnormal uterine bleeding    Amenorrhea    Central precocious puberty ([place])    Migraine headache without aura    PCOS (polycystic ovarian syndrome)    Pituitary adenoma ([facility])     Current Outpatient Medications  Medication Sig Dispense Refill   azelastine (ASTELIN) 0.1 % nasal spray Place into both nostrils 2 (two) times daily. Use in each nostril as directed     doxycycline (VIBRA-TABS) 100 MG tablet Take 1 tablet (100 mg total) by mouth 2 (two) times daily. 14 tablet 0   drospirenone-ethinyl estradiol (YASMIN) 3-0.03 MG tablet Take 1 tablet by mouth daily. 84 tablet 3   ferrous sulfate 325 (65 FE) MG tablet TAKE 1 TABLET BY MOUTH EVERY DAY 90 tablet 1   fluticasone (FLONASE) 50 MCG/ACT nasal spray Place 1 spray into both nostrils daily.     ibuprofen (ADVIL) 200 MG tablet Take 200 mg by mouth every 6 (six) hours as needed.     predniSONE (DELTASONE) 20 MG tablet  Take 1 tablet (20 mg total) by mouth daily with breakfast. 5 tablet 0   metFORMIN (GLUCOPHAGE) 500 MG tablet TAKE 1 TABLET BY MOUTH TWICE A DAY WITH MEALS (Patient not taking: Reported on 04/16/2021) 180 tablet 1   Multiple Vitamins-Minerals (ZINC PO) Take by mouth. (Patient not taking: Reported on 04/16/2021)     omeprazole (PRILOSEC) 40 MG capsule TAKE 1 CAPSULE BY MOUTH EVERY DAY (Patient not taking: Reported on 04/16/2021)  90 capsule 2   No current facility-administered medications for this visit.    Allergies  Allergen Reactions   Amoxicillin-Pot Clavulanate    Lupron Depot [Leuprolide] Hives   Nembutal [Pentobarbital Sodium]     Social History   Socioeconomic History   Marital status: Single    Spouse name: Not on file   Number of children: Not on file   Years of education: Not on file   Highest education level: Not on file  Occupational History   Not on file  Tobacco Use   Smoking status: Never   Smokeless tobacco: Never  Vaping Use   Vaping Use: Never used  Substance and Sexual Activity   Alcohol use: Yes    Alcohol/week: 1.0 standard drink    Types: 1 Glasses of wine per week    Comment: occasionally   Drug use: Not Currently   Sexual activity: Yes    Birth control/protection: Pill    Comment: Yasmin  Other Topics Concern   Not on file  Social History Education officer, museum at [employer].   Lives with parents and younger sister, 6 cats and 1 dog   Social Determinants of Health   Financial Resource Strain: Not on file  Food Insecurity: Not on file  Transportation Needs: Not on file  Physical Activity: Not on file  Stress: Not on file  Social Connections: Not on file  Intimate Partner Violence: Not on file    ROS Per hpi   Objective   Vitals as reported by the patient: There were no vitals filed for this visit.  Shuwanda was seen today for sinusitis.  Diagnoses and all orders for this visit:  Acute bacterial sinusitis -     doxycycline (VIBRA-TABS) 100 MG tablet; Take 1 tablet (100 mg total) by mouth 2 (two) times daily. -     predniSONE (DELTASONE) 20 MG tablet; Take 1 tablet (20 mg total) by mouth daily with breakfast.   PLAN Hx of penicillin allergy. Will send doxycycline and prednisone. Pt notes she may wait a few days to start this as she wants to rest up and see if symptoms improve - I think this is reasonable Return if worsening or failing to  improve Patient encouraged to call clinic with any questions, comments, or concerns.  I discussed the assessment and treatment plan with the patient. The patient was provided an opportunity to ask questions and all were answered. The patient agreed with the plan and demonstrated an understanding of the instructions.   The patient was advised to call back or seek an in-person evaluation if the symptoms worsen or if the condition fails to improve as anticipated.  I provided 14 minutes of non-face-to-face time during this encounter.  Maximiano Coss, NP  Primary [facility] at [facility]

## 2021-04-16 NOTE — Patient Instructions (Signed)
° ° ° °  If you have lab work done today you will be contacted with your lab results within the next 2 weeks.  If you have not heard from us then please contact us. The fastest way to get your results is to register for My Chart. ° ° °IF you received an x-ray today, you will receive an invoice from [facility]. Please contact [facility] at [phone] with questions or concerns regarding your invoice.  ° °IF you received labwork today, you will receive an invoice from [facility]. Please contact [facility] at [phone] with questions or concerns regarding your invoice.  ° °Our billing staff will not be able to assist you with questions regarding bills from these companies. ° °You will be contacted with the lab results as soon as they are available. The fastest way to get your results is to activate your My Chart account. Instructions are located on the last page of this paperwork. If you have not heard from us regarding the results in 2 weeks, please contact this office. °  ° ° ° °

## 2021-05-05 ENCOUNTER — Encounter: Payer: PRIVATE HEALTH INSURANCE | Admitting: Family Medicine

## 2021-06-10 ENCOUNTER — Ambulatory Visit (INDEPENDENT_AMBULATORY_CARE_PROVIDER_SITE_OTHER): Payer: PRIVATE HEALTH INSURANCE | Admitting: "Endocrinology

## 2021-06-25 ENCOUNTER — Ambulatory Visit (INDEPENDENT_AMBULATORY_CARE_PROVIDER_SITE_OTHER): Payer: BC Managed Care – PPO | Admitting: "Endocrinology

## 2021-07-29 NOTE — Progress Notes (Signed)
Subjective:  Subjective  Patient Name: Anna Parker Date of Birth: 10/31/1995  MRN: [id number]  Anna Parker  presents to the office today for follow-up evaluation and management of her history of goiter, oligomenorrhea, elevated testosterone, PCOS, hirsutism, fatigue, dyspepsia, and pituitary cyst/area of differential enhancement.  HISTORY OF PRESENT ILLNESS:   Anna Parker is a 25 y.o. Caucasian young lady.   Anna Parker was unaccompanied.  33. Anna Parker is a 25 y.o. young lady who was followed from an early age for precocious  pubertal development.   A. The child started [facility] agonist therapy as a very young child. I took care of her from 2006 to early 2012. At about that time she stopped her [facility] agonist therapy.    B. As part of her evaluation for precocity, Anna Parker had an MRI scan of her brain, performed with and without contrast in 2006, which revealed an "oval area of differential enhancement measuring 5.8 x 2.9 x 3.2 mm which was c/w a pituitary cyst, Rathke's cleft cyst, or small pituitary microadenoma".  There was also a "tiny area of increased signal on precontrast T1-weighted images along the mid to lower infundibulum".   C. A follow up brain MRI scan was performed with and without contrast in 2008. The radiologist stated that, "The previously identified cystic structure has involuted and the gland is now normal. There was a tiny questioned focus of T1 shortening on precontrast images near the infundibulum. I believe this are too has normalized. It may have represented a partial volume averaging of the clivus."   2. On 01/17/14 Anna Parker returned to our clinic, after a 3-year hiatus, for re-evaluation of her pituitary lesion and was seen by Dr. Baldo Ash. [name] had been discovered to have a benign tumor of the pituitary that had damaged his vision. Dr. Everitt Amber, our pediatric ophthalmologist, felt that she deserved an updated pituitary evaluation given the family history and her personal  history of pituitary findings. Mom also stated that they were told at their last endocrine visit that she would need repeat imaging in 5 years.   A. She had achieved a normal adult height and was at a healthy weight for her height. She reported normal menses every 28-36 days. She had not had significant changes in her vision, severe headaches, or galactorrhea.   B. A follow up MRI, with and without contrast, was performed on 01/27/14. Dr. [name] saw a 5 mm area of differential enhancement posterior to the insertion of the infundibulum. This area was smaller than in 2006 and was less intense than it was on the pre-contrast images from the MRI in 2008. 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 The appearance is not typical for pituitary microadenoma given the location posterior to the adenohypophysis.".   C. Subsequent follow up MRI on 12/28/15 showed that she had a 6.5 mm lesion posterior to the adenohypophysis. 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   3. Anna Parker's last [facility] visit occurred on 12/09/20. At that visit I continued her metformin 500 mg, twice daily and re-started her omeprazole, 20 mg, twice daily. I suggested again that she take a good MVI with iron, such a Centrum for Women or One-A-Day for Women.  A. In the interim she has been healthy. Her energy level is "really good if she gets enough sleep."   B. She has been trying to Eat Right more often. She is counting calories.   C. She has  been exercising 3-4 times per week.   D. She is taking metformin, 500 mg, twice daily. She stopped taking omeprazole due to her perception that it case reflux. She says she no longer has belly hunger, so no longer needs to take medication for that problem.  She no longer takes ferrous sulfate tablet daily.  She sometimes still misses doses of her meds.   E. She has been having more frequent headaches, now about 1-2 times per week. She feels that her headaches  are mostly due to her having a lot of nasal congestion and postnasal drip, but sometimes are related to academic stress and pre-menstrual tension.     4. Pertinent Review of Systems:  Constitutional: The patient feels "pretty good". She is working too hard. She is doing "better" emotionally. She has a good relationship with her boyfriend.   Eyes: Her eye glasses and contacts are working well. There are no other recognized eye problems.  Neck: The patient has no complaints of anterior neck swelling, soreness, tenderness, pressure, discomfort, or difficulty swallowing.   Heart: Heart rate increases with exercise or other physical activity. The patient has no complaints of palpitations, irregular heart beats, chest pain, or chest pressure.   Gastrointestinal: [place] is not having much belly hunger. She has no complaints of acid reflux, upset stomach, stomach aches or pains, or constipation.  Hands: No problems Legs: She still has some "arthritis" in her left ankle, but much less. Muscle mass and strength seem normal. There are no other complaints of numbness, tingling, burning, or pain. No edema is noted.  Feet: There are no other complaints of numbness, tingling, burning, or pain. No edema is noted. Neurologic: There are no recognized problems with muscle movement and strength, sensation, or coordination. GYN: Her LMP was last week. Menses occur regularly now and are not unusually heavy.  She is still on OCPs.  Skin: Since starting OCPs she does not have as much facial hair, so she does not often have to shave the corners of her mouth very often. The abdominal hair has decreased as well.     PAST MEDICAL, FAMILY, AND SOCIAL HISTORY  Past Medical History:  Diagnosis Date   Abnormal uterine bleeding    Amenorrhea    Central precocious puberty ([place])    Migraine headache without aura    PCOS (polycystic ovarian syndrome)    Pituitary adenoma ([place])     Family History  Problem Relation Age of  Onset   Asthma Mother    ADD / ADHD Mother    Arthritis Mother    Depression Father    Hyperlipidemia Father    Sleep apnea Father    Arthritis Father    Hypertension Maternal Grandmother    Cancer Maternal Grandmother        Endometrial ca   Thyroid disease Maternal Grandmother        hypothyroid   Kidney failure Maternal Grandmother    Coronary artery disease Maternal Grandmother    Heart failure Maternal Grandmother    Atrial fibrillation Maternal Grandmother    Hypertension Maternal Grandfather    Cancer Maternal Grandfather        Stage IV pancreatic CA   Hyperlipidemia Paternal Grandmother    Hypothyroidism Paternal Grandmother    Depression Paternal Grandmother    Macular degeneration Paternal Grandmother    Cataracts Paternal Grandmother    Arthritis Paternal Grandfather    Depression Paternal Grandfather    Cancer Paternal Grandfather  skin cancer   Seizures Sister 2       1 seizure--unknown cause--none since   Polycystic ovary syndrome Sister    S  Current Outpatient Medications:    drospirenone-ethinyl estradiol (YASMIN) 3-0.03 MG tablet, Take 1 tablet by mouth daily., Disp: 84 tablet, Rfl: 3   ibuprofen (ADVIL) 200 MG tablet, Take 200 mg by mouth every 6 (six) hours as needed., Disp: , Rfl:    azelastine (ASTELIN) 0.1 % nasal spray, Place into both nostrils 2 (two) times daily. Use in each nostril as directed (Patient not taking: Reported on 07/30/2021), Disp: , Rfl:    doxycycline (VIBRA-TABS) 100 MG tablet, Take 1 tablet (100 mg total) by mouth 2 (two) times daily. (Patient not taking: Reported on 07/30/2021), Disp: 14 tablet, Rfl: 0   ferrous sulfate 325 (65 FE) MG tablet, TAKE 1 TABLET BY MOUTH EVERY DAY (Patient not taking: Reported on 07/30/2021), Disp: 90 tablet, Rfl: 1   fluticasone (FLONASE) 50 MCG/ACT nasal spray, Place 1 spray into both nostrils daily. (Patient not taking: Reported on 07/30/2021), Disp: , Rfl:    metFORMIN (GLUCOPHAGE) 500 MG tablet,  TAKE 1 TABLET BY MOUTH TWICE A DAY WITH MEALS (Patient not taking: Reported on 04/16/2021), Disp: 180 tablet, Rfl: 1   Multiple Vitamins-Minerals (ZINC PO), Take by mouth. (Patient not taking: Reported on 04/16/2021), Disp: , Rfl:    omeprazole (PRILOSEC) 40 MG capsule, TAKE 1 CAPSULE BY MOUTH EVERY DAY (Patient not taking: Reported on 04/16/2021), Disp: 90 capsule, Rfl: 2   predniSONE (DELTASONE) 20 MG tablet, Take 1 tablet (20 mg total) by mouth daily with breakfast. (Patient not taking: Reported on 07/30/2021), Disp: 5 tablet, Rfl: 0  Allergies as of 07/30/2021 - Review Complete 07/30/2021  Allergen Reaction Noted   Amoxicillin-pot clavulanate  02/15/2011   Lupron depot [leuprolide] Hives 10/28/2016   Nembutal [pentobarbital sodium]  02/15/2011   Pentobarbital sodium  09/06/2019     reports that she has never smoked. She has never used smokeless tobacco. She reports current alcohol use of about 1.0 standard drink per week. She reports that she does not currently use drugs. Pediatric History  Patient Parents   Bunney,[name] (Mother)   Ruggerio,Charles (Father)   Other Topics Concern   Not on file  Social History Narrative   Senior at [employer] with boyfriend.   School: She finished her undergrad work at [employer] in [place], [place]. She is now taking nursing courses through [employer] at the campus in [place]. 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   Physical activities: She has been less physically active.  Primary Care Provider: Midge Minium, MD  REVIEW OF SYSTEMS: There are no other significant problems involving Anna Parker's other body systems.    Objective:  Objective  Vital Signs:  BP 108/76   Pulse 76   Wt 210 lb 3.2 oz (95.3 kg)   BMI 33.42 kg/m    Ht Readings from Last 3 Encounters:  02/16/21 5' 6.5" (1.689 m)  12/09/20 5' 6.38" (1.686 m)   05/02/20 5\' 7"  (1.702 m)   Wt Readings from Last 3 Encounters:  07/30/21 210 lb 3.2 oz (95.3 kg)  02/16/21 207 lb (93.9 kg)  12/09/20 201 lb 6.4 oz (91.4 kg)   HC Readings from Last 3 Encounters:  No data found for [facility]   Body surface area is 2.11 meters squared. Facility  age limit for growth percentiles is 20 years. Facility age limit for growth percentiles is 20 years.    PHYSICAL EXAM:  Constitutional: The patient appears healthy, but a bit heavier. Her height has plateaued. She has gained 9 pounds in the past 7 months. She is 80 pounds above her Ideal Body Weight of 130 pounds.  She is bright, alert, and looks good today. She is very mature.   Head: The head is normocephalic. Face: The face appears normal. There are no obvious dysmorphic features. She still has grade 2 sideburns.  Eyes: The eyes appear to be normally formed and spaced. Gaze is conjugate. There is no obvious arcus or proptosis. Moisture appears normal. Ears: The ears are normally placed and appear externally normal. Mouth: The oropharynx and tongue appear normal. Dentition appears to be normal for age. Oral moisture is normal. There is no oral  hyperpigmentation.  Neck: The neck appears to be visibly normal. The thyroid gland is a bit larger at about 21 grams in size. Today the left lobe is a bit larger than the right. The consistency of the lobes is normal.  The thyroid gland is not tender to palpation. Lungs: The lungs are clear to auscultation. Air movement is good. Heart: Heart rate and rhythm are regular. Heart sounds S1 and S2 are normal. I did not appreciate any pathologic cardiac murmurs. Abdomen: The abdomen is enlarged. Bowel sounds are normal. There is no obvious hepatomegaly, splenomegaly, or other mass effect.  Arms: Muscle size and bulk are normal for age. Hands: There is no obvious tremor. Phalangeal and metacarpophalangeal joints are normal. Palmar muscles are normal for age. Palmar skin is normal.  Palmar moisture is also normal. There is no palmar hyperpigmentation. She has some pallor of her fingernails today. Legs: Muscles appear normal for age.  Neurologic: Strength is normal for age in both the upper and lower extremities. Muscle tone is normal. Sensation to touch is normal in both legs.    LAB DATA:   Results for orders placed or performed in visit on 07/30/21 (from the past 672 hour(s))  POCT glycosylated hemoglobin (Hb A1C)   Collection Time: 07/30/21  8:48 AM  Result Value Ref Range   Hemoglobin A1C 4.9 4.0 - 5.6 %   HbA1c POC (<> result, manual entry)     HbA1c, POC (prediabetic range)     HbA1c, POC (controlled diabetic range)    POCT Glucose (Device for Home Use)   Collection Time: 07/30/21  8:48 AM  Result Value Ref Range   Glucose Fasting, POC     POC Glucose 92 70 - 99 mg/dl   Labs 07/30/21; HbA1c 4.9%, CBG 92   Labs 12/09/20: HbA1c 4.9%, CBG 88; TSH 1.25, free T4 1.2, free T3 3.3; CBC normal, except RBCs 5.41 (ref 3.8-5.1) and MCH 26.4 (ref 27-33); iron 61 (ref 40-190)  Labs 05/02/20: TSH 1.93, BMP normal; Hepatic function panel normal; CBC normal; cholesterol 234, triglycerides 94, HDL 81.90, LDL 133  Labs 02/18/20: HbA1c 4.9%; CBG 91; CBC normal; iron 109  Labs 08/20/19: HbA1c 5.1%, CBG 100; TSH 1.58, free T4 1.1, free T3 3.3, anti-thyroid antibodies negative; CBC normal, except MCH 26.5 (27-33), iron 36 (ref 40-190)   Labs 03/21/19: HbA1c 5.2%, CBG 117; TSH 2.31, free T4 1.1, free T3 3.2; CBC normal, except MCH 26.7 (ref 27-33); iron 69;   Labs 08/17/18: HbA1c 5.0%, CBG 101; CBC normal except MCH 26,7 (ref 27-33); iron 34 (ref 27-164)  MCH 26,.5  Labs 03/718:  HbA1c 5.1%, CBG 107; TSH 1.49, free T4 1.2, free T3 3.3; CMP normal; CBC normal, except MCH 26.9 (ref 27-33)  Labs 01/28/17: HbA1c 4.9%, CBG 88  Labs 01/12/17: TSH 1.49, free T4 1.1, free T3 3.0  Labs 10/20/16: HbA1c 5.1%, CBG 88  Labs 07/19/16: HbA1c 5.2%  Labs 01/14/16: HbA1c 5.0%  Labs 01/03/16:  TSH 1.58, free T4 1.1, free T3 2.9; CBC normal; iron 56  Labs 10/14/15: HbA1c 4.8%  Labs 07/14/15: HbA1c 5.2%  Labs 04/17/15: CMP normal; IGF-1 279, IGFBP-3 5.1  Labs 02/18/15: TSH 1.859, free T4 0.90, free T3 3.5; LH 16, FSH 6.4, testosterone 62, estradiol 45.9; ACTH 18 (normal 9-57), cortisol 12.4 (normal 4.2-22.4); prolactin 7.0; CBC normal; iron 118 (normal 42-145)  IMAGING:   MRI 09/09/18: There is a 6 mm, well-circumscribed T1 hyperintense ovoid lesion at the posterior left aspect of the sella, most 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    MRI 12/28/15: "Stable 6 mm hyperintense cystic lesion in the posterior sella. 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 There is no expansion of the sella. Pituitary neoplasm is considered unlikely. Calcification or ossification along the posterior meningeal falx. This has not significantly changed. There is no enhancement. This most likely represents benign ossification rather than a meningioma. Recommend 2 year follow-up to assure stability of this cyst given the patient's age. If the cyst remains stable following puberty, further follow-up may not be necessary.   Assessment and Plan:  Assessment  ASSESSMENT:  1. History of pituitary cyst/abnormal enhancement/brain cyst:   A. This area was smaller on her MRI in 2015 than it was in 2006 and 2008. Dr. [name], a very experienced neuroradiologist, felt that this area did not represent a pituitary tumor per se. She did not have any excessive pituitary hormone secretions. It was highly unlikely that this area was causing her fatigue or oligomenorrhea. However, Dr. [name] also mentioned an area of calcification of the mid-to-posterior falx and recommended follow up.  B. In 2017 her MRI was essentially unchanged. Dr. Jobe Igo was quite comfortable that the 6.5 mm lesion was a Rathke's cleft cyst. He was also comfortable with the ossifications being  benign.   C. As can be seen in her labs from June and August 2016, this lesion was non-functional, that is, it was not producing any excess amounts of the six anterior pituitary hormones. This lesion was also definitely posterior to the adenohypophysis, not part of the pituitary gland as her grand uncle's tumor was said to be.  D. Her MRI performed in January 2020 was again c/w being a small, Rathke's cleft cyst.   E. Her headaches are more frequent and are related to stress, nasal congestion, and the premenstrual period tension.   2. Overweight: This problem is worse. However, she says she has lost about 5 pounds in the past several months by eating more carefully and exercising more.  3. Goiter/thyroiditis:   A. Her thyroid gland had shrunk back to normal size at her December 2019 visit, had increased in size at her July and December 2020 visits, but then decreased in size. In April 2022 her gland was mildly enlarged. In December 2022 the gland is a bit larger and the lobes have shifted in size. The process of waxing and waning of thyroid gland size is c/w evolving Hashimoto's thyroiditis.   B. She was euthyroid in May 2017 and again in May 2018. She was mid-euthyroid in  August 2019. Her TFTs in July 2020 had decreased to about the 25% of the physiologic range. Her TFTS in December 2020 were again mid-euthyroid. Her TSH of 1.93 in September 2021 and of 1.25 in April 2022 were within the goal range of 1.0-2.0.   C. She is clinically euthyroid today.  4. Oligomenorrhea/PCOS/elevated testosterone, hirsutism:   A. The oligomenorrhea resolved with weight loss, but may recur with weight gain.   B. The oligomenorrhea was likely due to her obesity and PCOS/Stein-Leventhal Syndrome (SLS). Her LH/FSH ratio was elevated in June 2016, c/w PCOS/SLS. Her testosterone/estradiol ratio was similarly elevated, also c/w PCOS/SLS. Her prolactin was normal.    C. Her facial hair and abdominal hair have markedly decreased  since starting OCPs. Mom has the same problem, as does mom's mother. Laiza's hirsutism is not apparent today.  5. Fatigue, other:   A. This problem has improved when sh can have enough sleep.    B. Her TFTs, CBC, iron level, ACTH, and cortisol were all normal in June 2016. We did not find a hormonal cause of her fatigue.  6. Dyspepsia: She says her dyspepsia has resolved.   7. Pallor: Resolved. Her CBC and iron concentration were normal in May 2017.  Her CBC was normal in September 2021 and in April 2022. She is pallid again today. 8. Abnormal MCH/abnormal red blood cell:   A. Her MCH was low in August and in December 2019, but all of her other RBC indices were normal. Her [facility] was also mildly low in July and December 2020.   B. Her iron was low-normal in July 2020 and low in December 2020. Her iron was low-normal again in April 2022. She was taking iron for awhile, but stopped it to see if she still really needs it. We need to repeat her CBC and iron now.   C. I suspect that [place] is one of those people who do not absorb iron as readily from food. If so, she will need to take a good quality MVI with iron every day.  PLAN:  1. Diagnostic: HbA1c and CBG, TFTs, CBC, and iron today.  2. Therapeutic: Continue metformin, 500 mg, twice daily. Continue to Eat Right. Try to exercise for one hour per day.  Take either Centrum for Women or One-A-Day for Women 3. Patient education: I reviewed her medical problems, to include her obesity, PCOS, elevated testosterone, female hirsutism, iron deficiency anemia, and dyspepsia. We also discussed her goiter and Hashimoto's disease. We discussed the effects of OCPs on her facial hair issue. Yisel again asked appropriate questions and seemed very pleased with discussion and plan.  4. Follow-up: 6 months    Level of Service: This visit lasted in excess of 60 minutes. More than 50% of the visit was devoted to counseling.  Tillman Sers, MD, CDE Pediatric and Adult  Endocrinology

## 2021-07-30 ENCOUNTER — Other Ambulatory Visit: Payer: Self-pay

## 2021-07-30 ENCOUNTER — Ambulatory Visit (INDEPENDENT_AMBULATORY_CARE_PROVIDER_SITE_OTHER): Payer: PRIVATE HEALTH INSURANCE | Admitting: "Endocrinology

## 2021-07-30 ENCOUNTER — Encounter (INDEPENDENT_AMBULATORY_CARE_PROVIDER_SITE_OTHER): Payer: Self-pay | Admitting: "Endocrinology

## 2021-07-30 VITALS — BP 108/76 | HR 76 | Wt 210.2 lb

## 2021-07-30 DIAGNOSIS — D500 Iron deficiency anemia secondary to blood loss (chronic): Secondary | ICD-10-CM

## 2021-07-30 DIAGNOSIS — E049 Nontoxic goiter, unspecified: Secondary | ICD-10-CM

## 2021-07-30 DIAGNOSIS — E063 Autoimmune thyroiditis: Secondary | ICD-10-CM | POA: Diagnosis not present

## 2021-07-30 DIAGNOSIS — E6609 Other obesity due to excess calories: Secondary | ICD-10-CM | POA: Diagnosis not present

## 2021-07-30 DIAGNOSIS — N914 Secondary oligomenorrhea: Secondary | ICD-10-CM

## 2021-07-30 DIAGNOSIS — R1013 Epigastric pain: Secondary | ICD-10-CM

## 2021-07-30 DIAGNOSIS — R718 Other abnormality of red blood cells: Secondary | ICD-10-CM

## 2021-07-30 DIAGNOSIS — R5383 Other fatigue: Secondary | ICD-10-CM

## 2021-07-30 LAB — POCT GLYCOSYLATED HEMOGLOBIN (HGB A1C): Hemoglobin A1C: 4.9 % (ref 4.0–5.6)

## 2021-07-30 LAB — POCT GLUCOSE (DEVICE FOR HOME USE): POC Glucose: 92 mg/dl (ref 70–99)

## 2021-07-30 NOTE — Patient Instructions (Signed)
Follow up visit in 6 months. 

## 2021-07-31 LAB — CBC WITH DIFFERENTIAL/PLATELET
Absolute Monocytes: 576 cells/uL (ref 200–950)
Basophils Absolute: 43 cells/uL (ref 0–200)
Basophils Relative: 0.6 %
Eosinophils Absolute: 79 cells/uL (ref 15–500)
Eosinophils Relative: 1.1 %
HCT: 39.9 % (ref 35.0–45.0)
Hemoglobin: 13.2 g/dL (ref 11.7–15.5)
Lymphs Abs: 2225 cells/uL (ref 850–3900)
MCH: 27.4 pg (ref 27.0–33.0)
MCHC: 33.1 g/dL (ref 32.0–36.0)
MCV: 82.8 fL (ref 80.0–100.0)
MPV: 11.8 fL (ref 7.5–12.5)
Monocytes Relative: 8 %
Neutro Abs: 4277 cells/uL (ref 1500–7800)
Neutrophils Relative %: 59.4 %
Platelets: 222 10*3/uL (ref 140–400)
RBC: 4.82 10*6/uL (ref 3.80–5.10)
RDW: 13.5 % (ref 11.0–15.0)
Total Lymphocyte: 30.9 %
WBC: 7.2 10*3/uL (ref 3.8–10.8)

## 2021-07-31 LAB — TSH: TSH: 1.96 mIU/L

## 2021-07-31 LAB — T3, FREE: T3, Free: 3.5 pg/mL (ref 2.3–4.2)

## 2021-07-31 LAB — T4, FREE: Free T4: 1.2 ng/dL (ref 0.8–1.8)

## 2021-07-31 LAB — IRON: Iron: 40 ug/dL (ref 40–190)

## 2021-10-13 ENCOUNTER — Encounter (INDEPENDENT_AMBULATORY_CARE_PROVIDER_SITE_OTHER): Payer: Self-pay

## 2021-11-18 ENCOUNTER — Other Ambulatory Visit: Payer: Self-pay

## 2022-01-28 ENCOUNTER — Other Ambulatory Visit: Payer: Self-pay

## 2022-01-28 MED ORDER — DROSPIRENONE-ETHINYL ESTRADIOL 3-0.03 MG PO TABS: 1.0000 | ORAL_TABLET | Freq: Every day | ORAL | 0 refills | Status: DC

## 2022-01-28 NOTE — Telephone Encounter (Signed)
Last AEX 02/16/21--scheduled for 02/17/22.

## 2022-01-29 ENCOUNTER — Ambulatory Visit (INDEPENDENT_AMBULATORY_CARE_PROVIDER_SITE_OTHER): Payer: BC Managed Care – PPO | Admitting: "Endocrinology

## 2022-01-31 NOTE — Progress Notes (Deleted)
Subjective:  Subjective  Patient Name: Anna Parker Date of Birth: Mar 06, 1996  MRN: [id number]  Anna Parker  presents to the office today for follow-up evaluation and management of her history of goiter, oligomenorrhea, elevated testosterone, PCOS, hirsutism, fatigue, dyspepsia, and pituitary cyst/area of differential enhancement.  HISTORY OF PRESENT ILLNESS:   Anna Parker is a 26 y.o. Caucasian young lady.   Anna Parker was unaccompanied.  89. Anna Parker is a 26 y.o. young lady who was followed from an early age for precocious  pubertal development.   A. The child started [facility] agonist therapy as a very young child. I took care of her from 2006 to early 2012. At about that time she stopped her [facility] agonist therapy.    B. As part of her evaluation for precocity, Anna Parker had an MRI scan of her brain, performed with and without contrast in 2006, which revealed an "oval area of differential enhancement measuring 5.8 x 2.9 x 3.2 mm which was c/w a pituitary cyst, Rathke's cleft cyst, or small pituitary microadenoma".  There was also a "tiny area of increased signal on precontrast T1-weighted images along the mid to lower infundibulum".   C. A follow up brain MRI scan was performed with and without contrast in 2008. The radiologist stated that, "The previously identified cystic structure has involuted and the gland is now normal. There was a tiny questioned focus of T1 shortening on precontrast images near the infundibulum. I believe this are too has normalized. It may have represented a partial volume averaging of the clivus."   2. On 01/17/14 Anna Parker returned to our clinic, after a 3-year hiatus, for re-evaluation of her pituitary lesion and was seen by Dr. Baldo Ash. [name] had been discovered to have a benign tumor of the pituitary that had damaged his vision. Dr. Everitt Amber, our pediatric ophthalmologist, felt that she deserved an updated pituitary evaluation given the family history and her personal  history of pituitary findings. Mom also stated that they were told at their last endocrine visit that she would need repeat imaging in 5 years.   A. She had achieved a normal adult height and was at a healthy weight for her height. She reported normal menses every 28-36 days. She had not had significant changes in her vision, severe headaches, or galactorrhea.   B. A follow up MRI, with and without contrast, was performed on 01/27/14. Dr. [name] saw a 5 mm area of differential enhancement posterior to the insertion of the infundibulum. This area was smaller than in 2006 and was less intense than it was on the pre-contrast images from the MRI in 2008. 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 The appearance is not typical for pituitary microadenoma given the location posterior to the adenohypophysis.".   C. Subsequent follow up MRI on 12/28/15 showed that she had a 6.5 mm lesion posterior to the adenohypophysis. 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   3. Anna Parker's last [facility] visit occurred on 07/30/21. At that visit I continued her metformin 500 mg, twice daily and her omeprazole, 20 mg, twice daily. I suggested again that she take a good MVI with iron, such a Centrum for Women or One-A-Day for Women.  A. In the interim she has been healthy. Her energy level is "really good if she gets enough sleep."   B. She has been trying to Eat Right more often. She is counting calories.   C. She has been  exercising 3-4 times per week.   D. She is taking metformin, 500 mg, twice daily. She stopped taking omeprazole due to her perception that it case reflux. She says she no longer has belly hunger, so no longer needs to take medication for that problem.  She no longer takes ferrous sulfate tablet daily.  She sometimes still misses doses of her meds.   E. She has been having more frequent headaches, now about 1-2 times per week. She feels that her headaches are  mostly due to her having a lot of nasal congestion and postnasal drip, but sometimes are related to academic stress and pre-menstrual tension.     4. Pertinent Review of Systems:  Constitutional: The patient feels "pretty good". She is working too hard. She is doing "better" emotionally. She has a good relationship with her boyfriend.   Eyes: Her eye glasses and contacts are working well. There are no other recognized eye problems.  Neck: The patient has no complaints of anterior neck swelling, soreness, tenderness, pressure, discomfort, or difficulty swallowing.   Heart: Heart rate increases with exercise or other physical activity. The patient has no complaints of palpitations, irregular heart beats, chest pain, or chest pressure.   Gastrointestinal: Anna Parker is not having much belly hunger. She has no complaints of acid reflux, upset stomach, stomach aches or pains, or constipation.  Hands: No problems Legs: She still has some "arthritis" in her left ankle, but much less. Muscle mass and strength seem normal. There are no other complaints of numbness, tingling, burning, or pain. No edema is noted.  Feet: There are no other complaints of numbness, tingling, burning, or pain. No edema is noted. Neurologic: There are no recognized problems with muscle movement and strength, sensation, or coordination. GYN: Her LMP was last week. Menses occur regularly now and are not unusually heavy.  She is still on OCPs.  Skin: Since starting OCPs she does not have as much facial hair, so she does not often have to shave the corners of her mouth very often. The abdominal hair has decreased as well.     PAST MEDICAL, FAMILY, AND SOCIAL HISTORY  Past Medical History:  Diagnosis Date   Abnormal uterine bleeding    Amenorrhea    Central precocious puberty ([place])    Migraine headache without aura    PCOS (polycystic ovarian syndrome)    Pituitary adenoma ([place])     Family History  Problem Relation Age of Onset    Asthma Mother    ADD / ADHD Mother    Arthritis Mother    Depression Father    Hyperlipidemia Father    Sleep apnea Father    Arthritis Father    Hypertension Maternal Grandmother    Cancer Maternal Grandmother        Endometrial ca   Thyroid disease Maternal Grandmother        hypothyroid   Kidney failure Maternal Grandmother    Coronary artery disease Maternal Grandmother    Heart failure Maternal Grandmother    Atrial fibrillation Maternal Grandmother    Hypertension Maternal Grandfather    Cancer Maternal Grandfather        Stage IV pancreatic CA   Hyperlipidemia Paternal Grandmother    Hypothyroidism Paternal Grandmother    Depression Paternal Grandmother    Macular degeneration Paternal Grandmother    Cataracts Paternal Grandmother    Arthritis Paternal Grandfather    Depression Paternal Grandfather    Cancer Paternal Grandfather  skin cancer   Seizures Sister 2       1 seizure--unknown cause--none since   Polycystic ovary syndrome Sister    S  Current Outpatient Medications:    azelastine (ASTELIN) 0.1 % nasal spray, Place into both nostrils 2 (two) times daily. Use in each nostril as directed (Patient not taking: Reported on 07/30/2021), Disp: , Rfl:    doxycycline (VIBRA-TABS) 100 MG tablet, Take 1 tablet (100 mg total) by mouth 2 (two) times daily. (Patient not taking: Reported on 07/30/2021), Disp: 14 tablet, Rfl: 0   drospirenone-ethinyl estradiol (YASMIN) 3-0.03 MG tablet, Take 1 tablet by mouth daily., Disp: 28 tablet, Rfl: 0   ferrous sulfate 325 (65 FE) MG tablet, TAKE 1 TABLET BY MOUTH EVERY DAY (Patient not taking: Reported on 07/30/2021), Disp: 90 tablet, Rfl: 1   fluticasone (FLONASE) 50 MCG/ACT nasal spray, Place 1 spray into both nostrils daily. (Patient not taking: Reported on 07/30/2021), Disp: , Rfl:    ibuprofen (ADVIL) 200 MG tablet, Take 200 mg by mouth every 6 (six) hours as needed., Disp: , Rfl:    metFORMIN (GLUCOPHAGE) 500 MG tablet, TAKE 1  TABLET BY MOUTH TWICE A DAY WITH MEALS (Patient not taking: Reported on 04/16/2021), Disp: 180 tablet, Rfl: 1   Multiple Vitamins-Minerals (ZINC PO), Take by mouth. (Patient not taking: Reported on 04/16/2021), Disp: , Rfl:    omeprazole (PRILOSEC) 40 MG capsule, TAKE 1 CAPSULE BY MOUTH EVERY DAY (Patient not taking: Reported on 04/16/2021), Disp: 90 capsule, Rfl: 2   predniSONE (DELTASONE) 20 MG tablet, Take 1 tablet (20 mg total) by mouth daily with breakfast. (Patient not taking: Reported on 07/30/2021), Disp: 5 tablet, Rfl: 0  Allergies as of 02/01/2022 - Review Complete 07/30/2021  Allergen Reaction Noted   Amoxicillin-pot clavulanate  02/15/2011   Lupron depot [leuprolide] Hives 10/28/2016   Nembutal [pentobarbital sodium]  02/15/2011   Pentobarbital sodium  09/06/2019     reports that she has never smoked. She has never used smokeless tobacco. She reports current alcohol use of about 1.0 standard drink per week. She reports that she does not currently use drugs. Pediatric History  Patient Parents   Donegan,[name] (Mother)   Bulow,Charles (Father)   Other Topics Concern   Not on file  Social History Narrative   Senior at [employer] with boyfriend.   School: She finished her undergrad work at [employer] in [place], [place]. She is now taking nursing courses through [facility] [employer] at the campus in [place]. 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   Physical activities: She has been less physically active.  Primary Care Provider: Midge Minium, MD  REVIEW OF SYSTEMS: There are no other significant problems involving Rayna's other body systems.    Objective:  Objective  Vital Signs:  There were no vitals taken for this visit.   Ht Readings from Last 3 Encounters:  02/16/21 5' 6.5" (1.689 m)  12/09/20 5' 6.38" (1.686 m)  05/02/20 '5\' 7"'$  (1.702 m)   Wt  Readings from Last 3 Encounters:  07/30/21 210 lb 3.2 oz (95.3 kg)  02/16/21 207 lb (93.9 kg)  12/09/20 201 lb 6.4 oz (91.4 kg)   HC Readings from Last 3 Encounters:  No data found for HC   There is no height or weight on file to calculate BSA. Facility age limit for growth percentiles is 20 years. Facility  age limit for growth percentiles is 20 years.    PHYSICAL EXAM:  Constitutional: The patient appears healthy, but a bit heavier. Her height has plateaued. She has gained 9 pounds in the past 7 months. She is 80 pounds above her Ideal Body Weight of 130 pounds.  She is bright, alert, and looks good today. She is very mature.   Head: The head is normocephalic. Face: The face appears normal. There are no obvious dysmorphic features. She still has grade 2 sideburns.  Eyes: The eyes appear to be normally formed and spaced. Gaze is conjugate. There is no obvious arcus or proptosis. Moisture appears normal. Ears: The ears are normally placed and appear externally normal. Mouth: The oropharynx and tongue appear normal. Dentition appears to be normal for age. Oral moisture is normal. There is no oral  hyperpigmentation.  Neck: The neck appears to be visibly normal. The thyroid gland is a bit larger at about 21 grams in size. Today the left lobe is a bit larger than the right. The consistency of the lobes is normal.  The thyroid gland is not tender to palpation. Lungs: The lungs are clear to auscultation. Air movement is good. Heart: Heart rate and rhythm are regular. Heart sounds S1 and S2 are normal. I did not appreciate any pathologic cardiac murmurs. Abdomen: The abdomen is enlarged. Bowel sounds are normal. There is no obvious hepatomegaly, splenomegaly, or other mass effect.  Arms: Muscle size and bulk are normal for age. Hands: There is no obvious tremor. Phalangeal and metacarpophalangeal joints are normal. Palmar muscles are normal for age. Palmar skin is normal. Palmar moisture is also  normal. There is no palmar hyperpigmentation. She has some pallor of her fingernails today. Legs: Muscles appear normal for age.  Neurologic: Strength is normal for age in both the upper and lower extremities. Muscle tone is normal. Sensation to touch is normal in both legs.    LAB DATA:   No results found for this or any previous visit (from the past 672 hour(s)).  Labs 07/30/21; HbA1c 4.9%, CBG 92; TSH 1.96, free T4 1.2, free T3 3.5; CBC normal; iron 40 (ref 40-190)   Labs 12/09/20: HbA1c 4.9%, CBG 88; TSH 1.25, free T4 1.2, free T3 3.3; CBC normal, except RBCs 5.41 (ref 3.8-5.1) and MCH 26.4 (ref 27-33); iron 61 (ref 40-190)  Labs 05/02/20: TSH 1.93, BMP normal; Hepatic function panel normal; CBC normal; cholesterol 234, triglycerides 94, HDL 81.90, LDL 133  Labs 02/18/20: HbA1c 4.9%; CBG 91; CBC normal; iron 109  Labs 08/20/19: HbA1c 5.1%, CBG 100; TSH 1.58, free T4 1.1, free T3 3.3, anti-thyroid antibodies negative; CBC normal, except MCH 26.5 (27-33), iron 36 (ref 40-190)   Labs 03/21/19: HbA1c 5.2%, CBG 117; TSH 2.31, free T4 1.1, free T3 3.2; CBC normal, except MCH 26.7 (ref 27-33); iron 69;   Labs 08/17/18: HbA1c 5.0%, CBG 101; CBC normal except MCH 26,7 (ref 27-33); iron 34 (ref 27-164)  MCH 26,.5  Labs 03/718: HbA1c 5.1%, CBG 107; TSH 1.49, free T4 1.2, free T3 3.3; CMP normal; CBC normal, except MCH 26.9 (ref 27-33)  Labs 01/28/17: HbA1c 4.9%, CBG 88  Labs 01/12/17: TSH 1.49, free T4 1.1, free T3 3.0  Labs 10/20/16: HbA1c 5.1%, CBG 88  Labs 07/19/16: HbA1c 5.2%  Labs 01/14/16: HbA1c 5.0%  Labs 01/03/16: TSH 1.58, free T4 1.1, free T3 2.9; CBC normal; iron 56  Labs 10/14/15: HbA1c 4.8%  Labs 07/14/15: HbA1c 5.2%  Labs 04/17/15: CMP normal; IGF-1 279,  IGFBP-3 5.1  Labs 02/18/15: TSH 1.859, free T4 0.90, free T3 3.5; LH 16, FSH 6.4, testosterone 62, estradiol 45.9; ACTH 18 (normal 9-57), cortisol 12.4 (normal 4.2-22.4); prolactin 7.0; CBC normal; iron 118 (normal  42-145)  IMAGING:   MRI 09/09/18: There is a 6 mm, well-circumscribed T1 hyperintense ovoid lesion at the posterior left aspect of the sella, most 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    MRI 12/28/15: "Stable 6 mm hyperintense cystic lesion in the posterior sella. 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 There is no expansion of the sella. Pituitary neoplasm is considered unlikely. Calcification or ossification along the posterior meningeal falx. This has not significantly changed. There is no enhancement. This most likely represents benign ossification rather than a meningioma. Recommend 2 year follow-up to assure stability of this cyst given the patient's age. If the cyst remains stable following puberty, further follow-up may not be necessary.   Assessment and Plan:  Assessment  ASSESSMENT:  1. History of pituitary cyst/abnormal enhancement/brain cyst:   A. This area was smaller on her MRI in 2015 than it was in 2006 and 2008. Dr. [name], a very experienced neuroradiologist, felt that this area did not represent a pituitary tumor per se. She did not have any excessive pituitary hormone secretions. It was highly unlikely that this area was causing her fatigue or oligomenorrhea. However, Dr. [name] also mentioned an area of calcification of the mid-to-posterior falx and recommended follow up.  B. In 2017 her MRI was essentially unchanged. Dr. Jobe Igo was quite comfortable that the 6.5 mm lesion was a Rathke's cleft cyst. He was also comfortable with the ossifications being benign.   C. As can be seen in her labs from June and August 2016, this lesion was non-functional, that is, it was not producing any excess amounts of the six anterior pituitary hormones. This lesion was also definitely posterior to the adenohypophysis, not part of the pituitary gland as her grand uncle's tumor was said to be.  D. Her MRI performed in January 2020  was again c/w being a small, Rathke's cleft cyst.   E. Her headaches are more frequent and are related to stress, nasal congestion, and the premenstrual period tension.   2. Overweight: This problem is worse. However, she says she has lost about 5 pounds in the past several months by eating more carefully and exercising more.  3. Goiter/thyroiditis:   A. Her thyroid gland had shrunk back to normal size at her December 2019 visit, had increased in size at her July and December 2020 visits, but then decreased in size. In April 2022 her gland was mildly enlarged. In December 2022 the gland is a bit larger and the lobes have shifted in size. The process of waxing and waning of thyroid gland size is c/w evolving Hashimoto's thyroiditis.   B. She was euthyroid in May 2017 and again in May 2018. She was mid-euthyroid in August 2019. Her TFTs in July 2020 had decreased to about the 25% of the physiologic range. Her TFTS in December 2020 were again mid-euthyroid. Her TSH of 1.93 in September 2021 and of 1.25 in April 2022 were within the goal range of 1.0-2.0.   C. She is clinically euthyroid today.  4. Oligomenorrhea/PCOS/elevated testosterone, hirsutism:   A. The oligomenorrhea resolved with weight loss, but may recur with weight gain.   B. The oligomenorrhea was likely due to her obesity and PCOS/Stein-Leventhal Syndrome (SLS).  Her LH/FSH ratio was elevated in June 2016, c/w PCOS/SLS. Her testosterone/estradiol ratio was similarly elevated, also c/w PCOS/SLS. Her prolactin was normal.    C. Her facial hair and abdominal hair have markedly decreased since starting OCPs. Mom has the same problem, as does mom's mother. Suellen's hirsutism is not apparent today.  5. Fatigue, other:   A. This problem has improved when sh can have enough sleep.    B. Her TFTs, CBC, iron level, ACTH, and cortisol were all normal in June 2016. We did not find a hormonal cause of her fatigue.  6. Dyspepsia: She says her dyspepsia has  resolved.   7. Pallor: Resolved. Her CBC and iron concentration were normal in May 2017.  Her CBC was normal in September 2021 and in April 2022. She is pallid again today. 8. Abnormal MCH/abnormal red blood cell:   A. Her MCH was low in August and in December 2019, but all of her other RBC indices were normal. Her [facility] was also mildly low in July and December 2020.   B. Her iron was low-normal in July 2020 and low in December 2020. Her iron was low-normal again in April 2022. She was taking iron for awhile, but stopped it to see if she still really needs it. We need to repeat her CBC and iron now.   C. I suspect that Mea is one of those people who do not absorb iron as readily from food. If so, she will need to take a good quality MVI with iron every day.  PLAN:  1. Diagnostic: HbA1c and CBG, TFTs, CBC, and iron today.  2. Therapeutic: Continue metformin, 500 mg, twice daily. Continue to Eat Right. Try to exercise for one hour per day.  Take either Centrum for Women or One-A-Day for Women 3. Patient education: I reviewed her medical problems, to include her obesity, PCOS, elevated testosterone, female hirsutism, iron deficiency anemia, and dyspepsia. We also discussed her goiter and Hashimoto's disease. We discussed the effects of OCPs on her facial hair issue. Demeisha again asked appropriate questions and seemed very pleased with discussion and plan.  4. Follow-up: 6 months    Level of Service: This visit lasted in excess of 60 minutes. More than 50% of the visit was devoted to counseling.  Tillman Sers, MD, CDE Pediatric and Adult Endocrinology

## 2022-02-01 ENCOUNTER — Ambulatory Visit (INDEPENDENT_AMBULATORY_CARE_PROVIDER_SITE_OTHER): Payer: BC Managed Care – PPO | Admitting: "Endocrinology

## 2022-02-16 NOTE — Progress Notes (Signed)
26 y.o. G0P0000 Single Caucasian female here for annual exam.    Some headache the week prior to her period or the first day of her cycle. Advil treats this well.  Wants to continue her birth control pills.  She does have migraine headaches.  No aura.   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   PCP: Annye Asa, MD    Patient's last menstrual period was 02/11/2022 (exact date).     Period Cycle (Days): 30 Period Duration (Days): 3-4 Period Pattern: Regular Menstrual Flow: Moderate Menstrual Control: Maxi pad Dysmenorrhea:  (headaches prior to cycles)     Sexually active: Yes.    The current method of family planning is OCP (estrogen/progesterone).    Exercising: No.  The patient does not participate in regular exercise at present. Smoker:  no  Health Maintenance: Pap:  02-03-20 Neg History of abnormal Pap:  no MMG:  n/a Colonoscopy:  n/a BMD:   n/a  Result  n/a TDaP:  2018 Gardasil:   completed HIV: 02-16-21 NR Hep C: 02-16-21 Neg Screening Labs:  PCP   reports that she has never smoked. She has never used smokeless tobacco. She reports current alcohol use of about 2.0 standard drinks of alcohol per week. She reports that she does not currently use drugs.  Past Medical History:  Diagnosis Date   Abnormal uterine bleeding    Amenorrhea    Central precocious puberty ([place])    Migraine headache without aura    PCOS (polycystic ovarian syndrome)    Pituitary adenoma ([place])     Past Surgical History:  Procedure Laterality Date   left ankle fracture Left 2003   left ankle repair Left 2016   TONSILLECTOMY AND ADENOIDECTOMY     TYMPANOPLASTY      Current Outpatient Medications  Medication Sig Dispense Refill   drospirenone-ethinyl estradiol (YASMIN) 3-0.03 MG tablet Take 1 tablet by mouth daily. 28 tablet 0   ibuprofen (ADVIL) 200 MG tablet Take 200 mg by mouth every 6 (six) hours as needed.     fluticasone (FLONASE) 50 MCG/ACT  nasal spray Place 1 spray into both nostrils daily. (Patient not taking: Reported on 07/30/2021)     No current facility-administered medications for this visit.    Family History  Problem Relation Age of Onset   Asthma Mother    ADD / ADHD Mother    Arthritis Mother    Depression Father    Hyperlipidemia Father    Sleep apnea Father    Arthritis Father    Hypertension Maternal Grandmother    Cancer Maternal Grandmother        Endometrial ca   Thyroid disease Maternal Grandmother        hypothyroid   Kidney failure Maternal Grandmother    Coronary artery disease Maternal Grandmother    Heart failure Maternal Grandmother    Atrial fibrillation Maternal Grandmother    Hypertension Maternal Grandfather    Cancer Maternal Grandfather        Stage IV pancreatic CA   Hyperlipidemia Paternal Grandmother    Hypothyroidism Paternal Grandmother    Depression Paternal Grandmother    Macular degeneration Paternal Grandmother    Cataracts Paternal Grandmother    Arthritis Paternal Grandfather    Depression Paternal Grandfather    Cancer Paternal Grandfather        skin cancer   Seizures Sister 2       1 seizure--unknown cause--none since   Polycystic ovary  syndrome Sister     Review of Systems  All other systems reviewed and are negative.   Exam:   BP 122/70   Pulse 64   Ht 5' 6.25" (1.683 m)   Wt 212 lb (96.2 kg)   LMP 02/11/2022 (Exact Date)   SpO2 98%   BMI 33.96 kg/m     General appearance: alert, cooperative and appears stated age Head: normocephalic, without obvious abnormality, atraumatic Neck: no adenopathy, supple, symmetrical, trachea midline and thyroid normal to inspection and palpation Lungs: clear to auscultation bilaterally Breasts: normal appearance, no masses or tenderness, No nipple retraction or dimpling, No nipple discharge or bleeding, No axillary adenopathy Heart: regular rate and rhythm Abdomen: soft, non-tender; no masses, no  organomegaly Extremities: extremities normal, atraumatic, no cyanosis or edema Skin: skin color, texture, turgor normal. No rashes or lesions Lymph nodes: cervical, supraclavicular, and axillary nodes normal. Neurologic: grossly normal  Pelvic: External genitalia:  no lesions              No abnormal inguinal nodes palpated.              Urethra:  normal appearing urethra with no masses, tenderness or lesions              Bartholins and Skenes: normal                 Vagina: normal appearing vagina with normal color and discharge, no lesions              Cervix: no lesions              Pap taken: no Bimanual Exam:  Uterus:  normal size, contour, position, consistency, mobility, non-tender              Adnexa: no mass, fullness, tenderness            Chaperone was present for exam:  yes  Assessment:   Well woman visit with gynecologic exam. Hx central precocious puberty.  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  Stable on MRI, last done 2020. Followed by Dr. Tobe Sos. PCOS. Migraine without aura.  Plan: Mammogram screening age 55. Self breast awareness reviewed. Pap 2024. Guidelines for Calcium, Vitamin D, regular exercise program including cardiovascular and weight bearing exercise. STD screening.  Refill of Yasmin for one year.  Condom use recommended. Follow up annually and prn.   After visit summary provided.

## 2022-02-17 ENCOUNTER — Ambulatory Visit (INDEPENDENT_AMBULATORY_CARE_PROVIDER_SITE_OTHER): Payer: PRIVATE HEALTH INSURANCE | Admitting: Obstetrics and Gynecology

## 2022-02-17 ENCOUNTER — Encounter: Payer: Self-pay | Admitting: Obstetrics and Gynecology

## 2022-02-17 ENCOUNTER — Other Ambulatory Visit (HOSPITAL_COMMUNITY)
Admission: RE | Admit: 2022-02-17 | Discharge: 2022-02-17 | Disposition: A | Discharge status: Home / Self Care | Payer: PRIVATE HEALTH INSURANCE | Source: Ambulatory Visit | Attending: Obstetrics and Gynecology | Admitting: Obstetrics and Gynecology

## 2022-02-17 VITALS — BP 122/70 | HR 64 | Ht 66.25 in | Wt 212.0 lb

## 2022-02-17 DIAGNOSIS — Z1159 Encounter for screening for other viral diseases: Secondary | ICD-10-CM

## 2022-02-17 DIAGNOSIS — Z113 Encounter for screening for infections with a predominantly sexual mode of transmission: Secondary | ICD-10-CM | POA: Diagnosis not present

## 2022-02-17 DIAGNOSIS — Z01419 Encounter for gynecological examination (general) (routine) without abnormal findings: Secondary | ICD-10-CM | POA: Diagnosis not present

## 2022-02-17 DIAGNOSIS — Z114 Encounter for screening for human immunodeficiency virus [HIV]: Secondary | ICD-10-CM

## 2022-02-17 MED ORDER — DROSPIRENONE-ETHINYL ESTRADIOL 3-0.03 MG PO TABS: 1.0000 | ORAL_TABLET | Freq: Every day | ORAL | 3 refills | Status: DC

## 2022-02-17 NOTE — Patient Instructions (Addendum)
EXERCISE AND DIET:  We recommended that you start or continue a regular exercise program for good health. Regular exercise means any activity that makes your heart beat faster and makes you sweat.  We recommend exercising at least 30 minutes per day at least 3 days a week, preferably 4 or 5.  We also recommend a diet low in fat and sugar.  Inactivity, poor dietary choices and obesity can cause diabetes, heart attack, stroke, and kidney damage, among others.    ALCOHOL AND SMOKING:  Women should limit their alcohol intake to no more than 7 drinks/beers/glasses of wine (combined, not each!) per week. Moderation of alcohol intake to this level decreases your risk of breast cancer and liver damage. And of course, no recreational drugs are part of a healthy lifestyle.  And absolutely no smoking or even second hand smoke. Most people know smoking can cause heart and lung diseases, but did you know it also contributes to weakening of your bones? Aging of your skin?  Yellowing of your teeth and nails?  CALCIUM AND VITAMIN D:  Adequate intake of calcium and Vitamin D are recommended.  The recommendations for exact amounts of these supplements seem to change often, but generally speaking 600 mg of calcium (either carbonate or citrate) and 800 units of Vitamin D per day seems prudent. Certain women may benefit from higher intake of Vitamin D.  If you are among these women, your doctor will have told you during your visit.    PAP SMEARS:  Pap smears, to check for cervical cancer or precancers,  have traditionally been done yearly, although recent scientific advances have shown that most women can have pap smears less often.  However, every woman still should have a physical exam from her gynecologist every year. It will include a breast check, inspection of the vulva and vagina to check for abnormal growths or skin changes, a visual exam of the cervix, and then an exam to evaluate the size and shape of the uterus and  ovaries.  And after 24 years of age, a rectal exam is indicated to check for rectal cancers. We will also provide age appropriate advice regarding health maintenance, like when you should have certain vaccines, screening for sexually transmitted diseases, bone density testing, colonoscopy, mammograms, etc.   MAMMOGRAMS:  All women over 24 years old should have a yearly mammogram. Many facilities now offer a "3D" mammogram, which may cost around $50 extra out of pocket. If possible,  we recommend you accept the option to have the 3D mammogram performed.  It both reduces the number of women who will be called back for extra views which then turn out to be normal, and it is better than the routine mammogram at detecting truly abnormal areas.    COLONOSCOPY:  Colonoscopy to screen for colon cancer is recommended for all women at age 24.  We know, you hate the idea of the prep.  We agree, BUT, having colon cancer and not knowing it is worse!!  Colon cancer so often starts as a polyp that can be seen and removed at colonscopy, which can quite literally save your life!  And if your first colonoscopy is normal and you have no family history of colon cancer, most women don't have to have it again for 10 years.  Once every ten years, you can do something that may end up saving your life, right?  We will be happy to help you get it scheduled when you are ready.    Be sure to check your insurance coverage so you understand how much it will cost.  It may be covered as a preventative service at no cost, but you should check your particular policy.    Calcium Content in Foods Calcium is the most abundant mineral in the body. Most of the body's calcium supply is stored in bones and teeth. Calcium helps many parts of the body function normally, including: Blood and blood vessels. Nerves. Hormones. Muscles. Bones and teeth. When your calcium stores are low, you may be at risk for low bone mass, bone loss, and broken bones  (fractures). When you get enough calcium, it helps to support strong bones and teeth throughout your life. Calcium is especially important for: Children during growth spurts. Girls during adolescence. Women who are pregnant or breastfeeding. Women after their menstrual cycle stops (postmenopause). Women whose menstrual cycle has stopped due to anorexia nervosa or regular intense exercise. People who cannot eat or digest dairy products. Vegans. Recommended daily amounts of calcium: Women (ages 19 to 24): 1,000 mg per day. Women (ages 51 and older): 1,200 mg per day. Men (ages 19 to 70): 1,000 mg per day. Men (ages 71 and older): 1,200 mg per day. Women (ages 9 to 18): 1,300 mg per day. Men (ages 9 to 18): 1,300 mg per day. General information Eat foods that are high in calcium. Try to get most of your calcium from food. Some people may benefit from taking calcium supplements. Check with your health care provider or diet and nutrition specialist (dietitian) before starting any calcium supplements. Calcium supplements may interact with certain medicines. Too much calcium may cause other health problems, such as constipation and kidney stones. For the body to absorb calcium, it needs vitamin D. Sources of vitamin D include: Skin exposure to direct sunlight. Foods, such as egg yolks, liver, mushrooms, saltwater fish, and fortified milk. Vitamin D supplements. Check with your health care provider or dietitian before starting any vitamin D supplements. What foods are high in calcium?  Foods that are high in calcium contain more than 100 milligrams per serving. Fruits Fortified orange juice or other fruit juice, 300 mg per 8 oz serving. Vegetables Collard greens, 360 mg per 8 oz serving. Kale, 100 mg per 8 oz serving. Bok choy, 160 mg per 8 oz serving. Grains Fortified ready-to-eat cereals, 100 to 1,000 mg per 8 oz serving. Fortified frozen waffles, 200 mg in 2 waffles. Oatmeal, 140 mg in  1 cup. Meats and other proteins Sardines, canned with bones, 325 mg per 3 oz serving. Salmon, canned with bones, 180 mg per 3 oz serving. Canned shrimp, 125 mg per 3 oz serving. Baked beans, 160 mg per 4 oz serving. Tofu, firm, made with calcium sulfate, 253 mg per 4 oz serving. Dairy Yogurt, plain, low-fat, 310 mg per 6 oz serving. Nonfat milk, 300 mg per 8 oz serving. American cheese, 195 mg per 1 oz serving. Cheddar cheese, 205 mg per 1 oz serving. Cottage cheese 2%, 105 mg per 4 oz serving. Fortified soy, rice, or almond milk, 300 mg per 8 oz serving. Mozzarella, part skim, 210 mg per 1 oz serving. The items listed above may not be a complete list of foods high in calcium. Actual amounts of calcium may be different depending on processing. Contact a dietitian for more information. What foods are lower in calcium? Foods that are lower in calcium contain 50 mg or less per serving. Fruits Apple, about 6 mg. Banana, about 12 mg.   Vegetables Lettuce, 19 mg per 2 oz serving. Tomato, about 11 mg. Grains Rice, 4 mg per 6 oz serving. Boiled potatoes, 14 mg per 8 oz serving. White bread, 6 mg per slice. Meats and other proteins Egg, 27 mg per 2 oz serving. Red meat, 7 mg per 4 oz serving. Chicken, 17 mg per 4 oz serving. Fish, cod, or trout, 20 mg per 4 oz serving. Dairy Cream cheese, regular, 14 mg per 1 Tbsp serving. Brie cheese, 50 mg per 1 oz serving. Parmesan cheese, 70 mg per 1 Tbsp serving. The items listed above may not be a complete list of foods lower in calcium. Actual amounts of calcium may be different depending on processing. Contact a dietitian for more information. Summary Calcium is an important mineral in the body because it affects many functions. Getting enough calcium helps support strong bones and teeth throughout your life. Try to get most of your calcium from food. Calcium supplements may interact with certain medicines. Check with your health care provider  or dietitian before starting any calcium supplements. This information is not intended to replace advice given to you by your health care provider. Make sure you discuss any questions you have with your health care provider. Document Revised: 12/12/2019 Document Reviewed: 12/12/2019 Elsevier Patient Education  2023 Elsevier Inc.  

## 2022-02-18 LAB — SYPHILIS: RPR W/REFLEX TO RPR TITER AND TREPONEMAL ANTIBODIES, TRADITIONAL SCREENING AND DIAGNOSIS ALGORITHM: RPR Ser Ql: NONREACTIVE

## 2022-02-18 LAB — CERVICOVAGINAL ANCILLARY ONLY
Chlamydia: NEGATIVE
Comment: NEGATIVE
Comment: NEGATIVE
Comment: NORMAL
Neisseria Gonorrhea: NEGATIVE
Trichomonas: NEGATIVE

## 2022-02-18 LAB — HIV ANTIBODY (ROUTINE TESTING W REFLEX): HIV 1&2 Ab, 4th Generation: NONREACTIVE

## 2022-02-18 LAB — HEPATITIS C ANTIBODY: Hepatitis C Ab: NONREACTIVE

## 2022-04-01 ENCOUNTER — Encounter (INDEPENDENT_AMBULATORY_CARE_PROVIDER_SITE_OTHER): Payer: Self-pay

## 2022-12-15 ENCOUNTER — Encounter: Payer: Self-pay | Admitting: Obstetrics and Gynecology

## 2022-12-15 NOTE — Telephone Encounter (Signed)
Med refill request: Yasmin Last AEX: 02/17/22 /BS Next AEX: 02/21/23 /BS Last MMG (if hormonal med) N/A  Rx pended #[id number]  Refill authorized: Please Advise?

## 2022-12-16 MED ORDER — DROSPIRENONE-ETHINYL ESTRADIOL 3-0.03 MG PO TABS
1.0000 | ORAL_TABLET | Freq: Every day | ORAL | 0 refills | Status: DC
Start: 2022-12-16 — End: 2023-02-21

## 2023-02-07 NOTE — Progress Notes (Deleted)
27 y.o. G0P0000 Single Caucasian female here for annual exam.    PCP:     No LMP recorded.           Sexually active: {yes no:314532}  The current method of family planning is OCP (estrogen/progesterone).    Exercising: {yes no:314532}  {types:19826} Smoker:  no  Health Maintenance: Pap:  02/03/20 neg History of abnormal Pap:  no MMG:  n/a Colonoscopy:  n/a BMD:   n/a  Result  n/a TDaP:  2018 Gardasil:   yes HIV: 02/16/21 NR Hep C: 02/16/21 neg Screening Labs:  Hb today: ***, Urine today: ***   reports that she has never smoked. She has never used smokeless tobacco. She reports current alcohol use of about 2.0 standard drinks of alcohol per week. She reports that she does not currently use drugs.  Past Medical History:  Diagnosis Date   Abnormal uterine bleeding    Amenorrhea    Central precocious puberty ([facility])    Migraine headache without aura    PCOS (polycystic ovarian syndrome)    Pituitary adenoma ([facility])     Past Surgical History:  Procedure Laterality Date   left ankle fracture Left 2003   left ankle repair Left 2016   TONSILLECTOMY AND ADENOIDECTOMY     TYMPANOPLASTY      Current Outpatient Medications  Medication Sig Dispense Refill   drospirenone-ethinyl estradiol (YASMIN) 3-0.03 MG tablet Take 1 tablet by mouth daily. 84 tablet 0   fluticasone (FLONASE) 50 MCG/ACT nasal spray Place 1 spray into both nostrils daily. (Patient not taking: Reported on 07/30/2021)     ibuprofen (ADVIL) 200 MG tablet Take 200 mg by mouth every 6 (six) hours as needed.     No current facility-administered medications for this visit.    Family History  Problem Relation Age of Onset   Asthma Mother    ADD / ADHD Mother    Arthritis Mother    Depression Father    Hyperlipidemia Father    Sleep apnea Father    Arthritis Father    Hypertension Maternal Grandmother    Cancer Maternal Grandmother        Endometrial ca   Thyroid disease Maternal Grandmother        hypothyroid    Kidney failure Maternal Grandmother    Coronary artery disease Maternal Grandmother    Heart failure Maternal Grandmother    Atrial fibrillation Maternal Grandmother    Hypertension Maternal Grandfather    Cancer Maternal Grandfather        Stage IV pancreatic CA   Hyperlipidemia Paternal Grandmother    Hypothyroidism Paternal Grandmother    Depression Paternal Grandmother    Macular degeneration Paternal Grandmother    Cataracts Paternal Grandmother    Arthritis Paternal Grandfather    Depression Paternal Grandfather    Cancer Paternal Grandfather        skin cancer   Seizures Sister 2       1 seizure--unknown cause--none since   Polycystic ovary syndrome Sister     Review of Systems  Exam:   There were no vitals taken for this visit.    General appearance: alert, cooperative and appears stated age Head: normocephalic, without obvious abnormality, atraumatic Neck: no adenopathy, supple, symmetrical, trachea midline and thyroid normal to inspection and palpation Lungs: clear to auscultation bilaterally Breasts: normal appearance, no masses or tenderness, No nipple retraction or dimpling, No nipple discharge or bleeding, No axillary adenopathy Heart: regular rate and rhythm Abdomen: soft, non-tender; no  masses, no organomegaly Extremities: extremities normal, atraumatic, no cyanosis or edema Skin: skin color, texture, turgor normal. No rashes or lesions Lymph nodes: cervical, supraclavicular, and axillary nodes normal. Neurologic: grossly normal  Pelvic: External genitalia:  no lesions              No abnormal inguinal nodes palpated.              Urethra:  normal appearing urethra with no masses, tenderness or lesions              Bartholins and Skenes: normal                 Vagina: normal appearing vagina with normal color and discharge, no lesions              Cervix: no lesions              Pap taken: {yes no:314532} Bimanual Exam:  Uterus:  normal size, contour,  position, consistency, mobility, non-tender              Adnexa: no mass, fullness, tenderness              Rectal exam: {yes no:314532}.  Confirms.              Anus:  normal sphincter tone, no lesions  Chaperone was present for exam:  ***  Assessment:   Well woman visit with gynecologic exam.   Plan: Mammogram screening discussed. Self breast awareness reviewed. Pap and HR HPV as above. Guidelines for Calcium, Vitamin D, regular exercise program including cardiovascular and weight bearing exercise.   Follow up annually and prn.   Additional counseling given.  {yes [id number]. _______ minutes face to face time of which over 50% was spent in counseling.    After visit summary provided.

## 2023-02-21 ENCOUNTER — Ambulatory Visit: Payer: PRIVATE HEALTH INSURANCE | Admitting: Obstetrics and Gynecology

## 2023-02-21 ENCOUNTER — Other Ambulatory Visit: Payer: Self-pay

## 2023-02-21 MED ORDER — DROSPIRENONE-ETHINYL ESTRADIOL 3-0.03 MG PO TABS
1.0000 | ORAL_TABLET | Freq: Every day | ORAL | 0 refills | Status: DC
Start: 2023-02-21 — End: 2023-04-11

## 2023-02-21 NOTE — Telephone Encounter (Signed)
Med refill request: Yas Last AEX: 02/17/22 Next AEX: 04/11/23 Last MMG (if hormonal med) n/a Refill authorized: Please Advise, #84, 0RF

## 2023-03-28 NOTE — Progress Notes (Signed)
27 y.o. G75P0000 Married Caucasian female here for annual exam.    Hx bartholin cyst.   Mildly uncomfortable and enlarges when she is on her period.  No drainage.   Likes her birth control pills.   Having more headaches, increase with stress and toward the end of her cycle.  No aura. Tylenol or Advil works to treat.  She has these HA even before starting COCs.   She will see a new provider for follow up of her pituitary.  She will some someone at [facility]. She may be due for an MRI.  Moving to [place] in the future.  PCP:   Neena Rhymes  Patient's last menstrual period was 04/05/2023 (exact date).     Period Duration (Days): 2-5 Period Pattern: Regular Menstrual Flow: Moderate, Heavy Menstrual Control: Maxi pad Dysmenorrhea: (!) Mild Dysmenorrhea Symptoms: Cramping, Diarrhea, Headache     Sexually active: Yes.    The current method of family planning is OCP (estrogen/progesterone).    Exercising: No.   exercise Smoker:  no  Health Maintenance: Pap:  02/03/20 neg History of abnormal Pap:  no MMG:  n/a Colonoscopy:  n/a BMD:   n/a  Result  n/a TDaP:  2018 Gardasil:   yes HIV: 2023 NR Hep C: 2023 neg Screening Labs:  PCP   reports that she has never smoked. She has never used smokeless tobacco. She reports that she does not currently use alcohol. She reports that she does not currently use drugs.  Past Medical History:  Diagnosis Date   Abnormal uterine bleeding    Amenorrhea    Central precocious puberty ([facility])    Migraine headache without aura    PCOS (polycystic ovarian syndrome)    Pituitary adenoma ([facility])     Past Surgical History:  Procedure Laterality Date   left ankle fracture Left 2003   left ankle repair Left 2016   TONSILLECTOMY AND ADENOIDECTOMY     TYMPANOPLASTY      Current Outpatient Medications  Medication Sig Dispense Refill   drospirenone-ethinyl estradiol (YASMIN) 3-0.03 MG tablet Take 1 tablet by mouth daily. 84 tablet 0   fluticasone  (FLONASE) 50 MCG/ACT nasal spray Place 1 spray into both nostrils daily.     ibuprofen (ADVIL) 200 MG tablet Take 200 mg by mouth every 6 (six) hours as needed.     No current facility-administered medications for this visit.    Family History  Problem Relation Age of Onset   Asthma Mother    ADD / ADHD Mother    Arthritis Mother    Depression Father    Hyperlipidemia Father    Sleep apnea Father    Arthritis Father    Hypertension Maternal Grandmother    Cancer Maternal Grandmother        Endometrial ca   Thyroid disease Maternal Grandmother        hypothyroid   Kidney failure Maternal Grandmother    Coronary artery disease Maternal Grandmother    Heart failure Maternal Grandmother    Atrial fibrillation Maternal Grandmother    Hypertension Maternal Grandfather    Cancer Maternal Grandfather        Stage IV pancreatic CA   Hyperlipidemia Paternal Grandmother    Hypothyroidism Paternal Grandmother    Depression Paternal Grandmother    Macular degeneration Paternal Grandmother    Cataracts Paternal Grandmother    Arthritis Paternal Grandfather    Depression Paternal Grandfather    Cancer Paternal Grandfather        skin  cancer   Seizures Sister 2       1 seizure--unknown cause--none since   Polycystic ovary syndrome Sister     Review of Systems  Constitutional: Negative.   HENT: Negative.    Eyes: Negative.   Respiratory: Negative.    Cardiovascular: Negative.   Gastrointestinal: Negative.   Endocrine: Negative.   Genitourinary: Negative.   Musculoskeletal: Negative.   Skin: Negative.   Allergic/Immunologic: Negative.   Neurological: Negative.   Hematological: Negative.   Psychiatric/Behavioral: Negative.      Exam:   BP 104/70   Pulse 91   Ht 5' 6.25" (1.683 m)   Wt 205 lb (93 kg)   LMP 04/05/2023 (Exact Date)   SpO2 99%   BMI 32.84 kg/m     General appearance: alert, cooperative and appears stated age Head: normocephalic, without obvious  abnormality, atraumatic Neck: no adenopathy, supple, symmetrical, trachea midline and thyroid normal to inspection and palpation Lungs: clear to auscultation bilaterally Breasts: normal appearance, no masses or tenderness, No nipple retraction or dimpling, No nipple discharge or bleeding, No axillary adenopathy Heart: regular rate and rhythm Abdomen: soft, non-tender; no masses, no organomegaly Extremities: extremities normal, atraumatic, no cyanosis or edema Skin: skin color, texture, turgor normal. No rashes or lesions Lymph nodes: cervical, supraclavicular, and axillary nodes normal. Neurologic: grossly normal  Pelvic: External genitalia:  no lesions              No abnormal inguinal nodes palpated.              Urethra:  normal appearing urethra with no masses, tenderness or lesions              Bartholins and Skenes: normal                 Vagina: normal appearing vagina with normal color and discharge, no lesions              Cervix: no lesions              Pap taken: yes Bimanual Exam:  Uterus:  normal size, contour, position, consistency, mobility, non-tender              Adnexa: no mass, fullness, tenderness       Chaperone was present for exam:  Chaperone declined by the patient.   Assessment:   Well woman visit with gynecologic exam. Hx central precocious puberty.  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  Stable on MRI, last done 2020. Followed by Dr. Fransico Michael. PCOS. Migraine without aura.  Plan: Mammogram screening discussed. Self breast awareness reviewed. Pap and reflex HR HPV testing.  Guidelines for Calcium, Vitamin D, regular exercise program including cardiovascular and weight bearing exercise. Refill of Yasmin for one year.   Follow up annually and prn.

## 2023-04-11 ENCOUNTER — Other Ambulatory Visit (HOSPITAL_COMMUNITY)
Admission: RE | Admit: 2023-04-11 | Discharge: 2023-04-11 | Disposition: A | Discharge status: Home / Self Care | Payer: PRIVATE HEALTH INSURANCE | Source: Ambulatory Visit | Attending: Obstetrics and Gynecology | Admitting: Obstetrics and Gynecology

## 2023-04-11 ENCOUNTER — Encounter: Payer: Self-pay | Admitting: Obstetrics and Gynecology

## 2023-04-11 ENCOUNTER — Ambulatory Visit (INDEPENDENT_AMBULATORY_CARE_PROVIDER_SITE_OTHER): Payer: PRIVATE HEALTH INSURANCE | Admitting: Obstetrics and Gynecology

## 2023-04-11 VITALS — BP 104/70 | HR 91 | Ht 66.25 in | Wt 205.0 lb

## 2023-04-11 DIAGNOSIS — Z01419 Encounter for gynecological examination (general) (routine) without abnormal findings: Secondary | ICD-10-CM

## 2023-04-11 DIAGNOSIS — Z3041 Encounter for surveillance of contraceptive pills: Secondary | ICD-10-CM

## 2023-04-11 DIAGNOSIS — Z124 Encounter for screening for malignant neoplasm of cervix: Secondary | ICD-10-CM | POA: Diagnosis present

## 2023-04-11 MED ORDER — DROSPIRENONE-ETHINYL ESTRADIOL 3-0.03 MG PO TABS
1.0000 | ORAL_TABLET | Freq: Every day | ORAL | 3 refills | Status: DC
Start: 2023-04-11 — End: 2024-04-04

## 2023-04-11 NOTE — Patient Instructions (Signed)

## 2024-01-26 ENCOUNTER — Telehealth: Payer: Self-pay

## 2024-01-26 NOTE — Telephone Encounter (Signed)
 Patient states that she has a bartholin gland cyst that she has had drained a couple of times she is inquiring what she need to do to have the glad removed. Please advise.

## 2024-01-30 ENCOUNTER — Telehealth: Payer: Self-pay | Admitting: *Deleted

## 2024-01-30 NOTE — Telephone Encounter (Signed)
 Call returned to patient. Patient reports Painful Bartholin gland cyst, requesting earlier OV. Noticed over the past week, has increased in pain and tenderness the past few days. Tried one epson salt bath, reports bleeding after. Area is tender and swollen, not hard.   Denies fever/chills.   OV scheduled for 6/4 at 0915. Patient aware this is a work-in appt, may be a wait. Advised to continue epsom salt soaks, can take OTC Motrin 800 mg q 8 hrs for pain. Advised I will update Dr. Colvin Dec, our office will return call if any additional recommendations. Patient agreeable.   Routing to Dr. Colvin Dec for final review.

## 2024-01-30 NOTE — Telephone Encounter (Signed)
 I agree with the office visit.   Bleeding can be a sign that the cyst is starting to drain.

## 2024-01-31 NOTE — Telephone Encounter (Signed)
 Encounter closed

## 2024-01-31 NOTE — Progress Notes (Unsigned)
 GYNECOLOGY  VISIT   HPI: 28 y.o.   Married  Caucasian female   [id number] with Patient's last menstrual period was 01/19/2024 (exact date).   here for: Painful Bartholin Cyst. Has come back, bleeding, slight pain, slight odor. Would like to discuss having sack removed.     Denies fever.     Hx of Bartholin Cyst in past.   The cyst has recurred about 4 times per patient.    GYNECOLOGIC HISTORY: Patient's last menstrual period was 01/19/2024 (exact date). Contraception:  OCP Menopausal hormone therapy:  n/a Last 2 paps:  04/11/23 neg, 02/13/20 neg  History of abnormal Pap or positive HPV:  no Mammogram:  n/a        OB History     Gravida  0   Para  0   Term  0   Preterm  0   AB  0   Living  0      SAB  0   IAB  0   Ectopic  0   Multiple  0   Live Births  0              Patient Active Problem List   Diagnosis Date Noted   Physical exam 04/30/2019   Migraine 11/15/2018   Elevated testosterone  level in female 01/30/2017   Female hirsutism 01/30/2017   PCOS (polycystic ovarian syndrome) 01/30/2017   Mass of posterior pituitary (HCC) 01/14/2016   Dyspepsia 03/26/2015   Obesity 02/17/2015   Oligomenorrhea 02/17/2015   Other fatigue 02/17/2015   Goiter 02/17/2015   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 84 tablet 3   fluticasone (FLONASE) 50 MCG/ACT nasal spray Place 1 spray  into both nostrils daily.     ibuprofen (ADVIL) 200 MG tablet Take 200 mg by mouth every 6 (six) hours as needed.     rizatriptan (MAXALT) 5 MG tablet Take 5-10 mg by mouth.     No current facility-administered medications for this visit.     ALLERGIES: Lupron depot [leuprolide], Nembutal [pentobarbital sodium], and Pentobarbital sodium  Family History  Problem Relation Age of Onset   Asthma Mother    ADD / ADHD Mother    Arthritis Mother    Depression Father    Hyperlipidemia Father    Sleep apnea Father    Arthritis Father    Hypertension Maternal Grandmother    Cancer Maternal Grandmother        Endometrial ca   Thyroid  disease Maternal Grandmother        hypothyroid   Kidney failure Maternal Grandmother    Coronary artery disease Maternal Grandmother    Heart failure Maternal Grandmother    Atrial fibrillation Maternal Grandmother    Hypertension Maternal Grandfather    Cancer Maternal Grandfather        Stage IV pancreatic CA  Hyperlipidemia Paternal Grandmother    Hypothyroidism Paternal Grandmother    Depression Paternal Grandmother    Macular degeneration Paternal Grandmother    Cataracts Paternal Grandmother    Arthritis Paternal Grandfather    Depression Paternal Grandfather    Cancer Paternal Grandfather        skin cancer   Seizures Sister 2       1 seizure--unknown cause--none since   Polycystic ovary syndrome Sister     Social History   Socioeconomic History   Marital status: Married    Spouse name: Not on file   Number of children: Not on file   Years of education: Not on file   Highest education level: Not on file  Occupational History   Not on file  Tobacco Use   Smoking status: Never   Smokeless tobacco: Never  Vaping Use   Vaping status: Never Used  Substance and Sexual Activity   Alcohol use: Not Currently    Comment: occasionally   Drug use: Not Currently   Sexual activity: Yes    Partners: Male    Birth control/protection: OCP   Other Topics Concern   Not on file  Social History Narrative   Senior at [employer] with boyfriend.   Social Drivers of Corporate investment banker Strain: Not on file  Food Insecurity: Low Risk  (06/01/2023)   Received from [facility]   Hunger Vital Sign    Worried About Running Out of Food in the Last Year: Never true    Ran Out of Food in the Last Year: Never true  Transportation Needs: No Transportation Needs (06/01/2023)   Received from [facility]    In the past 12 months, has lack of reliable transportation kept you from medical appointments, meetings, work or from getting things needed for daily living? : No  Physical Activity: Not on file  Stress: Not on file  Social Connections: Unknown (01/11/2022)   Received from [facility], [facility]   Social Network    Social Network: Not on file  Intimate Partner Violence: Unknown (12/03/2021)   Received from [facility], [facility]   HITS    Physically Hurt: Not on file    Insult or Talk Down To: Not on file    Threaten Physical Harm: Not on file    Scream or Curse: Not on file    Review of Systems  All other systems reviewed and are negative.   PHYSICAL EXAMINATION:   BP 114/78 (BP Location: Left Arm, Patient Position: Sitting)   Pulse 65   LMP 01/19/2024 (Exact Date)   SpO2 98%     General appearance: alert, cooperative and appears stated age  Pelvic: External genitalia:  1 cm abscess of margin of left vulva and thigh.  Draining foul smelling fluid.  Feels firm underneath like a sebaceous cyst.                Urethra:  normal appearing urethra with no masses, tenderness or                Bartholin Glands normal.  Chaperone was present for exam:  Cottie Diss, CMA  ASSESSMENT:  Left vulvar/thigh abscess.  Draining.  I suspect an infected sebaceous cyst.  Normal Bartholin glands.   PLAN:  Wound cx.  Bactrim  DS po bid x 7 days.  Tylenol or ibuprofen for pain.  Follow up  for annual exam and prn.

## 2024-02-01 ENCOUNTER — Ambulatory Visit: Payer: PRIVATE HEALTH INSURANCE | Admitting: Obstetrics and Gynecology

## 2024-02-01 ENCOUNTER — Encounter: Payer: Self-pay | Admitting: Obstetrics and Gynecology

## 2024-02-01 VITALS — BP 114/78 | HR 65

## 2024-02-01 DIAGNOSIS — L0291 Cutaneous abscess, unspecified: Secondary | ICD-10-CM

## 2024-02-01 MED ORDER — SULFAMETHOXAZOLE-TRIMETHOPRIM 800-160 MG PO TABS: 1.0000 | ORAL_TABLET | Freq: Two times a day (BID) | ORAL | 0 refills | Status: AC

## 2024-02-01 NOTE — Patient Instructions (Signed)
 Pocket of Fluid in the Skin (Epidermoid Cyst): What to Know  An epidermoid cyst is a small lump under your skin. The cyst contains a substance that is thick and oily. What are the causes? A blocked hair follicle. A hair curls and re-enters the skin instead of growing straight out of the skin. A blocked pore. Irritated skin. An injury to the skin. Certain conditions that are passed from parent to child. Human papillomavirus (HPV). This happens rarely when cysts occur on the bottom of the feet. Long-term sun damage to the skin. What increases the risk? Having acne. Being female. Having an injury to the skin. Being past puberty. Certain conditions that are passed down through family (genetic disorder). What are the signs or symptoms? The only sign of this type of cyst may be a small, painless lump under the skin. These cysts are usually painless, but they can get infected. Symptoms of infection may include: Redness. Inflammation. Tenderness. Warmth. Fever. A bad-smelling substance that drains from the cyst. Pus that drains from the cyst. How is this treated? If a cyst becomes inflamed, treatment may include: Opening and draining the cyst. Antibiotics. Shots of medicines called steroids that help lessen inflammation. Surgery to remove the cyst if it's large, painful, or could turn into cancer. Do not try to open or squeeze a cyst yourself. Follow these instructions at home: Medicines Take your medicines only as told. If you were given antibiotics, take them as told. Do not stop taking them even if you start to feel better. General instructions Keep the area around your cyst clean and dry. Wear loose, dry clothing. Avoid touching your cyst. Check your cyst every day for signs of infection. Check for: Redness, swelling, or pain. Fluid or blood. Warmth. Pus or a bad smell. Keep all follow-up visits to make sure there's no discomfort or infection. Contact a doctor if: You have  any signs of infection. Your cyst doesn't get better or gets worse. You get a cyst that looks different from other cysts you've had. You have a fever. You have redness that spreads from the cyst. This information is not intended to replace advice given to you by your health care provider. Make sure you discuss any questions you have with your health care provider. Document Revised: 04/01/2023 Document Reviewed: 04/01/2023 Elsevier Patient Education  2024 [employer]  Skin Abscess  A skin abscess is an infected area on or under your skin. It contains pus and other material. An abscess may also be called a furuncle, carbuncle, or boil. It is often the result of an infection caused by bacteria. An abscess can occur in or on almost any part of your body. Sometimes, an abscess may break open (rupture) on its own. In most cases, it will keep getting worse unless it is treated. An abscess can cause pain and make you feel ill. An untreated abscess can cause infection to spread to other parts of your body or your bloodstream. The abscess may need to be drained. You may also need to take antibiotics. What are the causes? An abscess occurs when germs, like bacteria, pass through your skin and cause an infection. This may be caused by: A scrape or cut on your skin. A puncture wound through your skin, such as a needle injection or insect bite. Blocked oil or sweat glands. Blocked and infected hair follicles. A fluid-filled sac that forms beneath your skin (sebaceous cyst) and becomes infected. What increases the risk? You may be more likely  to develop an abscess if: You have problems with blood circulation, or you have a weak body defense system (immune system). You have diabetes. You have dry and irritated skin. You get injections often or use IV drugs. You have a foreign body in a wound, such as a splinter. You smoke or use tobacco products. What are the signs or symptoms? Symptoms of this  condition include: A painful, firm bump under the skin. A bump with pus at the top. This may break through the skin and drain. Other symptoms include: Redness and swelling around the abscess. Warmth or tenderness. Swelling of the lymph nodes (glands) near the abscess. A sore on the skin. How is this diagnosed? This condition may be diagnosed based on a physical exam and your medical history. You may also have tests done, such as: A test of a sample of pus. This may be done to find what is causing the infection. Blood tests. Imaging tests, such as an ultrasound, CT scan, or MRI. How is this treated? A small abscess that drains on its own may not need to be treated. Treatment for larger abscesses may include: Moist heat or a heat pack applied to the area a few times a day. Incision and drainage. This is a procedure to drain the abscess. Antibiotics. For a severe abscess, you may first get antibiotics through an IV and then change to antibiotics by mouth. Follow these instructions at home: Medicines Take over-the-counter and prescription medicines only as told by your provider. If you were prescribed antibiotics, take them as told by your provider. Do not stop using the antibiotic even if you start to feel better. Abscess care  If you have an abscess that has not drained, apply heat to the affected area. Use the heat source that your provider recommends, such as a moist heat pack or a heating pad. Place a towel between your skin and the heat source. Leave the heat on for 20-30 minutes at a time. If your skin turns bright red, remove the heat right away to prevent burns. The risk of burns is higher if you cannot feel pain, heat, or cold. Follow instructions from your provider about how to take care of your abscess. Make sure you: Cover the abscess with a bandage (dressing). Wash your hands with soap and water for at least 20 seconds before and after you change the dressing or gauze. If soap  and water are not available, use hand sanitizer. Change your dressing or gauze as told by your provider. Check your abscess every day for signs of an infection that is getting worse. Check for: More redness, swelling, pain, or tenderness. More fluid or blood. Warmth. More pus or a worse smell. General instructions To avoid spreading the infection: Do not share personal care items, towels, or hot tubs with others. Avoid making skin contact with other people. Be careful when getting rid of used dressings, wound packing, or any drainage from the abscess. Do not use any products that contain nicotine or tobacco. These products include cigarettes, chewing tobacco, and vaping devices, such as e-cigarettes. If you need help quitting, ask your provider. Do not use any creams, ointments, or liquids unless you have been told to by your provider. Contact a health care provider if: You see redness that spreads quickly or red streaks on your skin spreading away from the abscess. You have any signs of worse infection at the abscess. You vomit every time you eat or drink. You have a fever,  chills, or muscle aches. The cyst or abscess returns. Get help right away if: You have severe pain. You make less pee (urine) than normal. This information is not intended to replace advice given to you by your health care provider. Make sure you discuss any questions you have with your health care provider. Document Revised: 03/31/2022 Document Reviewed: 03/31/2022 Elsevier Patient Education  2024 [employer].

## 2024-02-04 ENCOUNTER — Ambulatory Visit: Payer: Self-pay | Admitting: Obstetrics and Gynecology

## 2024-02-04 LAB — WOUND CULTURE
MICRO NUMBER:: 16537974
SPECIMEN QUALITY:: ADEQUATE

## 2024-02-17 NOTE — Telephone Encounter (Signed)
 Encounter closed

## 2024-02-20 ENCOUNTER — Ambulatory Visit: Payer: PRIVATE HEALTH INSURANCE | Admitting: Obstetrics and Gynecology

## 2024-04-03 ENCOUNTER — Other Ambulatory Visit: Payer: Self-pay | Admitting: Obstetrics and Gynecology

## 2024-04-04 NOTE — Telephone Encounter (Signed)
 Med refill request:drospirenone -ethinyl estradiol    Last AEX: 04/11/23 last OV 02/01/24 Next AEX: not scheduled message sent to fd  Last MMG (if hormonal med) n/a Refill authorized: Last rx 04/11/23 #84 with 3 refills. Please approve or deny
